# Patient Record
Sex: Male | Born: 1950 | Race: White | Hispanic: No | Marital: Married | Smoking: Former smoker
Health system: Southern US, Community
[De-identification: ages and names within clinical notes are randomized; demographics above are authoritative.]

## PROBLEM LIST (undated history)

## (undated) DIAGNOSIS — Z148 Genetic carrier of other disease: Secondary | ICD-10-CM

## (undated) DIAGNOSIS — Z95828 Presence of other vascular implants and grafts: Secondary | ICD-10-CM

## (undated) DIAGNOSIS — Z973 Presence of spectacles and contact lenses: Secondary | ICD-10-CM

## (undated) DIAGNOSIS — K219 Gastro-esophageal reflux disease without esophagitis: Secondary | ICD-10-CM

## (undated) DIAGNOSIS — G709 Myoneural disorder, unspecified: Secondary | ICD-10-CM

## (undated) DIAGNOSIS — G4733 Obstructive sleep apnea (adult) (pediatric): Secondary | ICD-10-CM

## (undated) DIAGNOSIS — Z800 Family history of malignant neoplasm of digestive organs: Secondary | ICD-10-CM

## (undated) DIAGNOSIS — Z860101 Personal history of adenomatous and serrated colon polyps: Secondary | ICD-10-CM

## (undated) DIAGNOSIS — R351 Nocturia: Secondary | ICD-10-CM

## (undated) DIAGNOSIS — I493 Ventricular premature depolarization: Secondary | ICD-10-CM

## (undated) DIAGNOSIS — Z86010 Personal history of colonic polyps: Secondary | ICD-10-CM

## (undated) DIAGNOSIS — M51369 Other intervertebral disc degeneration, lumbar region without mention of lumbar back pain or lower extremity pain: Secondary | ICD-10-CM

## (undated) DIAGNOSIS — I714 Abdominal aortic aneurysm, without rupture, unspecified: Secondary | ICD-10-CM

## (undated) DIAGNOSIS — C61 Malignant neoplasm of prostate: Secondary | ICD-10-CM

## (undated) DIAGNOSIS — J45909 Unspecified asthma, uncomplicated: Secondary | ICD-10-CM

## (undated) DIAGNOSIS — K603 Anal fistula, unspecified: Secondary | ICD-10-CM

## (undated) DIAGNOSIS — E119 Type 2 diabetes mellitus without complications: Secondary | ICD-10-CM

## (undated) DIAGNOSIS — Z8679 Personal history of other diseases of the circulatory system: Secondary | ICD-10-CM

## (undated) DIAGNOSIS — Z974 Presence of external hearing-aid: Secondary | ICD-10-CM

## (undated) DIAGNOSIS — K649 Unspecified hemorrhoids: Secondary | ICD-10-CM

## (undated) DIAGNOSIS — M5136 Other intervertebral disc degeneration, lumbar region: Secondary | ICD-10-CM

## (undated) DIAGNOSIS — Z298 Encounter for other specified prophylactic measures: Secondary | ICD-10-CM

## (undated) DIAGNOSIS — E785 Hyperlipidemia, unspecified: Secondary | ICD-10-CM

## (undated) DIAGNOSIS — C3490 Malignant neoplasm of unspecified part of unspecified bronchus or lung: Secondary | ICD-10-CM

## (undated) DIAGNOSIS — B029 Zoster without complications: Secondary | ICD-10-CM

## (undated) DIAGNOSIS — T7840XA Allergy, unspecified, initial encounter: Secondary | ICD-10-CM

## (undated) DIAGNOSIS — R001 Bradycardia, unspecified: Secondary | ICD-10-CM

## (undated) DIAGNOSIS — Z8709 Personal history of other diseases of the respiratory system: Secondary | ICD-10-CM

## (undated) DIAGNOSIS — C439 Malignant melanoma of skin, unspecified: Secondary | ICD-10-CM

## (undated) DIAGNOSIS — I10 Essential (primary) hypertension: Secondary | ICD-10-CM

## (undated) HISTORY — DX: Encounter for other specified prophylactic measures: Z29.8

## (undated) HISTORY — PX: COLONOSCOPY: SHX174

## (undated) HISTORY — PX: POLYPECTOMY: SHX149

## (undated) HISTORY — PX: APPENDECTOMY: SHX54

## (undated) HISTORY — DX: Genetic carrier of other disease: Z14.8

## (undated) HISTORY — PX: PROSTATE SURGERY: SHX751

## (undated) HISTORY — PX: OTHER SURGICAL HISTORY: SHX169

## (undated) HISTORY — DX: Zoster without complications: B02.9

## (undated) HISTORY — DX: Allergy, unspecified, initial encounter: T78.40XA

## (undated) HISTORY — DX: Hyperlipidemia, unspecified: E78.5

## (undated) HISTORY — DX: Obstructive sleep apnea (adult) (pediatric): G47.33

## (undated) HISTORY — PX: LUNG CANCER SURGERY: SHX702

## (undated) HISTORY — PX: BACK SURGERY: SHX140

## (undated) HISTORY — DX: Unspecified asthma, uncomplicated: J45.909

## (undated) HISTORY — DX: Gastro-esophageal reflux disease without esophagitis: K21.9

## (undated) HISTORY — DX: Malignant neoplasm of prostate: C61

## (undated) HISTORY — DX: Malignant melanoma of skin, unspecified: C43.9

## (undated) HISTORY — DX: Bradycardia, unspecified: R00.1

---

## 1999-12-28 ENCOUNTER — Emergency Department (HOSPITAL_COMMUNITY): Admission: EM | Admit: 1999-12-28 | Discharge: 1999-12-28 | Payer: Self-pay | Admitting: Emergency Medicine

## 2000-02-13 ENCOUNTER — Encounter: Payer: Self-pay | Admitting: Neurosurgery

## 2000-02-13 ENCOUNTER — Ambulatory Visit (HOSPITAL_COMMUNITY): Admission: RE | Admit: 2000-02-13 | Discharge: 2000-02-13 | Payer: Self-pay | Admitting: Neurosurgery

## 2000-02-16 ENCOUNTER — Encounter: Payer: Self-pay | Admitting: Neurosurgery

## 2000-02-16 ENCOUNTER — Ambulatory Visit (HOSPITAL_COMMUNITY): Admission: RE | Admit: 2000-02-16 | Discharge: 2000-02-16 | Payer: Self-pay | Admitting: Neurosurgery

## 2000-02-26 ENCOUNTER — Encounter: Payer: Self-pay | Admitting: Neurosurgery

## 2000-02-27 ENCOUNTER — Encounter: Payer: Self-pay | Admitting: Neurosurgery

## 2000-02-27 ENCOUNTER — Inpatient Hospital Stay (HOSPITAL_COMMUNITY): Admission: RE | Admit: 2000-02-27 | Discharge: 2000-03-02 | Payer: Self-pay | Admitting: Neurosurgery

## 2000-02-27 HISTORY — PX: LUMBAR LAMINECTOMY: SHX95

## 2000-03-13 ENCOUNTER — Encounter: Admission: RE | Admit: 2000-03-13 | Discharge: 2000-06-11 | Payer: Self-pay | Admitting: Neurosurgery

## 2000-03-21 ENCOUNTER — Ambulatory Visit (HOSPITAL_COMMUNITY): Admission: RE | Admit: 2000-03-21 | Discharge: 2000-03-21 | Payer: Self-pay | Admitting: Neurosurgery

## 2000-03-21 ENCOUNTER — Encounter: Payer: Self-pay | Admitting: Neurosurgery

## 2000-04-05 ENCOUNTER — Encounter: Admission: RE | Admit: 2000-04-05 | Discharge: 2000-07-04 | Payer: Self-pay | Admitting: Neurosurgery

## 2000-06-12 ENCOUNTER — Encounter: Admission: RE | Admit: 2000-06-12 | Discharge: 2000-09-10 | Payer: Self-pay | Admitting: Neurosurgery

## 2000-07-29 ENCOUNTER — Encounter: Payer: Self-pay | Admitting: Neurosurgery

## 2000-07-29 ENCOUNTER — Ambulatory Visit (HOSPITAL_COMMUNITY): Admission: RE | Admit: 2000-07-29 | Discharge: 2000-07-29 | Payer: Self-pay | Admitting: Neurosurgery

## 2000-09-13 ENCOUNTER — Encounter: Admission: RE | Admit: 2000-09-13 | Discharge: 2000-09-13 | Payer: Self-pay | Admitting: Neurosurgery

## 2004-12-18 ENCOUNTER — Encounter: Admission: RE | Admit: 2004-12-18 | Discharge: 2004-12-18 | Payer: Self-pay | Admitting: Family Medicine

## 2005-03-26 ENCOUNTER — Ambulatory Visit: Payer: Self-pay | Admitting: Internal Medicine

## 2005-04-02 DIAGNOSIS — Z860100 Personal history of colon polyps, unspecified: Secondary | ICD-10-CM | POA: Insufficient documentation

## 2005-04-06 ENCOUNTER — Ambulatory Visit: Payer: Self-pay | Admitting: Internal Medicine

## 2009-11-08 ENCOUNTER — Encounter (INDEPENDENT_AMBULATORY_CARE_PROVIDER_SITE_OTHER): Payer: Self-pay | Admitting: *Deleted

## 2009-11-09 ENCOUNTER — Ambulatory Visit: Payer: Self-pay | Admitting: Internal Medicine

## 2009-11-09 ENCOUNTER — Encounter (INDEPENDENT_AMBULATORY_CARE_PROVIDER_SITE_OTHER): Payer: Self-pay | Admitting: *Deleted

## 2009-11-22 ENCOUNTER — Ambulatory Visit: Payer: Self-pay | Admitting: Internal Medicine

## 2009-12-02 ENCOUNTER — Encounter: Payer: Self-pay | Admitting: Internal Medicine

## 2009-12-31 DIAGNOSIS — Z8546 Personal history of malignant neoplasm of prostate: Secondary | ICD-10-CM

## 2009-12-31 DIAGNOSIS — C61 Malignant neoplasm of prostate: Secondary | ICD-10-CM

## 2009-12-31 HISTORY — DX: Malignant neoplasm of prostate: C61

## 2009-12-31 HISTORY — DX: Personal history of malignant neoplasm of prostate: Z85.46

## 2010-06-30 HISTORY — PX: ROBOT ASSISTED LAPAROSCOPIC RADICAL PROSTATECTOMY: SHX5141

## 2010-11-13 ENCOUNTER — Ambulatory Visit (HOSPITAL_BASED_OUTPATIENT_CLINIC_OR_DEPARTMENT_OTHER): Admission: RE | Admit: 2010-11-13 | Discharge: 2010-11-13 | Payer: Self-pay | Admitting: Urology

## 2010-11-13 HISTORY — PX: CYSTOSCOPY WITH URETHRAL DILATATION: SHX5125

## 2011-01-21 ENCOUNTER — Encounter: Payer: Self-pay | Admitting: Unknown Physician Specialty

## 2011-02-01 ENCOUNTER — Other Ambulatory Visit: Payer: Self-pay | Admitting: Dermatology

## 2011-03-13 LAB — POCT I-STAT 4, (NA,K, GLUC, HGB,HCT)
Glucose, Bld: 120 mg/dL — ABNORMAL HIGH (ref 70–99)
HCT: 44 % (ref 39.0–52.0)

## 2011-04-04 ENCOUNTER — Other Ambulatory Visit: Payer: Self-pay | Admitting: Dermatology

## 2011-04-17 ENCOUNTER — Other Ambulatory Visit: Payer: Self-pay | Admitting: Dermatology

## 2011-05-18 NOTE — Op Note (Signed)
[place]  Patient:    Jesse Perez, Jesse Perez                   MRN: [id number] Proc. Date: 02/27/00 Adm. Date:  [id number] Attending:  Emeterio Reeve                           Operative Report  PREOPERATIVE DIAGNOSIS:  Herniated disk at L4.  POSTOPERATIVE DIAGNOSIS:  L3-4 herniated disk with intradural extension.  OPERATION PERFORMED:  L4 hemilaminectomy, L3 hemisemilaminectomy and exploration of the L4-5 and L3-4 disk spaces.  SURGEON:  Payton Doughty, M.D.  ANESTHESIA:  General endotracheal.  PREP:  Sterile Betadine prep and scrub with alcohol wipe.  COMPLICATIONS:  CSF leak.  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  DESCRIPTION OF PROCEDURE:  The patient was taken to the operating room, smoothly anesthetized and intubated, and placed prone on the operating table.  Following  shave, prep and drape in the usual sterile fashion, the skin was infiltrated with 1% lidocaine 1:400,000 epinephrine.  The skin was incised from the bottom of L5 to the bottom of L3 and lamina of L3 and L4 were exposed on the right side in the subperiosteal plane.  Intraoperative x-ray confirmed correctness of the level.  Hemilaminectomy of L4 was carried out as well as a hemisemilaminectomy of L3 on the right inferior side. The ligamentum flavum was identified, elevated and removed. The L4 root was demonstrated as it rounded the L4 pedicle.  Medial retraction of the dural sac at that level revealed large fragments of disk which were removed  without difficulty.  This was followed down to the L4-5 interspace which was found to be intact without evidence of an annular tear.  Working above the 4 root and  lateral to it, exploration was carried superiorly.  At the L3-4 interspace, more disk material was found.  It was carefully dissected and was found to go  into the 3-4 disk space but it was also found to pierce the dura just above the beginning of the L4 root on the right side.  This was dissected free and carefully removed from the dural sac and was slightly adherent to several small nerves which were carefully dissected off of it and gently pushed back into the sac.  The defect n the sac where the disk had passed through was covered with thrombin soaked Gelfoam. There was no leak to Valsalva.  The plan was to keep the patient down postoperatively.  Following this, the wound was irrigated and hemostasis assured.  The nerve roots carefully explored to make sure that they were free of disk.  The fascia was reapproximated with 3-0 Vicryl in interrupted fashion.  The subcutaneous tissues were reapproximated 3-0 Vicryl in interrupted fashion.  The subcuticular tissues were reapproximated 3-0 Vicryl in interrupted fashion.  The skin was closed with 3-0 nylon in a running locked fashion.  Betadine and Telfa dressing was applied and made occlusive with Op-Site.  The patient then returned to the recovery room in  good condition. DD:  02/27/00 TD:  02/27/00 Job: [id number]

## 2011-05-18 NOTE — Discharge Summary (Signed)
[name]  Patient:    Jesse Perez, Jesse Perez                   MRN: [id number] Adm. Date:  [id number] Disc. Date: [id number] Attending:  Emeterio Reeve                           Discharge Summary  ADMISSION DIAGNOSIS:  Herniated nucleus pulposus L3-4 with intradural extension.  DISCHARGE DIAGNOSES:  Herniated nucleus pulposus L3-4 with intradural extension. Right dorsiflexor weakness.  CONDITION ON DISCHARGE:  Stable.  HOSPITAL COURSE:  The patient is a 60 year old individual who had significant lumbar radiculopathy on the right with a severe amount of pain. He was found to have a large herniated nucleus pulposus at the level of L3-4. He underwent surgical extirpation of the disk. However, at that time of the surgery, it was found that the disk had ruptured through the dura; and there was a significant rent in the dura. He underwent surgical repair of the rent. However, postoperatively, it was also noted that he had evidence of foot drop on the right lower extremity. He was maintained at bedrest for the first 48 hours. Subsequently, he was ambulated. He tolerated this well. He does have foot drop on the right side and numbness in the L5 distribution on that right side. His incision is clean and dry. He has been using a minimal amount of pain medication. He will be seen in the office for further outpatient follow-up in a weeks time for suture removal. Prescription for Vicodin and Xanax was written at the time of discharge. He will also be given a walker at the time of discharge. DD:  03/02/00 TD:  03/04/00 Job: [id number]

## 2011-08-27 ENCOUNTER — Emergency Department (INDEPENDENT_AMBULATORY_CARE_PROVIDER_SITE_OTHER): Payer: BC Managed Care – PPO

## 2011-08-27 ENCOUNTER — Other Ambulatory Visit: Payer: Self-pay

## 2011-08-27 ENCOUNTER — Emergency Department (HOSPITAL_BASED_OUTPATIENT_CLINIC_OR_DEPARTMENT_OTHER)
Admission: EM | Admit: 2011-08-27 | Discharge: 2011-08-27 | Disposition: A | Discharge status: Home / Self Care | Payer: BC Managed Care – PPO | Attending: Emergency Medicine | Admitting: Emergency Medicine

## 2011-08-27 ENCOUNTER — Encounter: Payer: Self-pay | Admitting: Family Medicine

## 2011-08-27 DIAGNOSIS — J329 Chronic sinusitis, unspecified: Secondary | ICD-10-CM

## 2011-08-27 DIAGNOSIS — I10 Essential (primary) hypertension: Secondary | ICD-10-CM

## 2011-08-27 DIAGNOSIS — R55 Syncope and collapse: Secondary | ICD-10-CM | POA: Insufficient documentation

## 2011-08-27 DIAGNOSIS — R51 Headache: Secondary | ICD-10-CM

## 2011-08-27 DIAGNOSIS — J3489 Other specified disorders of nose and nasal sinuses: Secondary | ICD-10-CM

## 2011-08-27 DIAGNOSIS — E119 Type 2 diabetes mellitus without complications: Secondary | ICD-10-CM | POA: Insufficient documentation

## 2011-08-27 HISTORY — DX: Essential (primary) hypertension: I10

## 2011-08-27 LAB — BASIC METABOLIC PANEL
BUN: 18 mg/dL (ref 6–23)
CO2: 24 mEq/L (ref 19–32)
Chloride: 102 mEq/L (ref 96–112)
Creatinine, Ser: 0.8 mg/dL (ref 0.50–1.35)
Glucose, Bld: 113 mg/dL — ABNORMAL HIGH (ref 70–99)

## 2011-08-27 LAB — CBC
HCT: 43.2 % (ref 39.0–52.0)
Hemoglobin: 15.2 g/dL (ref 13.0–17.0)
MCHC: 35.2 g/dL (ref 30.0–36.0)
WBC: 9 10*3/uL (ref 4.0–10.5)

## 2011-08-27 LAB — DIFFERENTIAL
Lymphocytes Relative: 28 % (ref 12–46)
Lymphs Abs: 2.5 10*3/uL (ref 0.7–4.0)
Monocytes Absolute: 0.5 10*3/uL (ref 0.1–1.0)
Monocytes Relative: 6 % (ref 3–12)
Neutro Abs: 5.1 10*3/uL (ref 1.7–7.7)

## 2011-08-27 LAB — CARDIAC PANEL(CRET KIN+CKTOT+MB+TROPI): Relative Index: 1.4 (ref 0.0–2.5)

## 2011-08-27 MED ORDER — AZITHROMYCIN 250 MG PO TABS: 500.0000 mg | ORAL_TABLET | Freq: Every day | ORAL | Status: AC

## 2011-08-27 MED ORDER — AMLODIPINE BESYLATE 10 MG PO TABS: 5.0000 mg | ORAL_TABLET | Freq: Every day | ORAL | Status: DC

## 2011-08-27 NOTE — ED Notes (Signed)
Pt sts he has h/o diabetes and htn and was taking meds but was able to stop meds and control with diet and exercise.

## 2011-08-27 NOTE — ED Provider Notes (Signed)
Medical screening examination/treatment/procedure(s) were performed by non-physician practitioner and as supervising physician I was immediately available for consultation/collaboration.   Charles B. Bernette Mayers, MD 08/27/11 1807

## 2011-08-27 NOTE — ED Provider Notes (Signed)
History     CSN: [id number] Arrival date & time: 08/27/2011  3:48 PM  Chief Complaint  Patient presents with  . Hypertension   HPI Comments: Pt states that he was sitting in a meeting at work and he became diaphoretic and not feeling normal:pt states that he went to the nurse and felt near syncopal and developed a headache:pt denies cp or sob:pt states that he was diagnosed with htn and diabetes 2 years ago and then last year after a year of exercise and eating right they took him off all of his medication and he has not had any problems:pt states that the last time he was seen was in October of 2011  Patient is a 60 y.o. male presenting with hypertension. The history is provided by the patient. No language interpreter was used.  Hypertension This is a recurrent problem. The current episode started today. The problem occurs constantly. The problem has been unchanged. Associated symptoms include diaphoresis and headaches. Pertinent negatives include no abdominal pain, chest pain, coughing, fever, nausea, neck pain or numbness. The symptoms are aggravated by nothing. He has tried nothing for the symptoms.    Past Medical History  Diagnosis Date  . Hypertension   . Diabetes mellitus     Past Surgical History  Procedure Date  . Back surgery   . Prostate surgery   . Appendectomy     Family History  Problem Relation Age of Onset  . Cancer Mother   . Hypertension Father   . Stroke Father   . Cancer Sister     History  Substance Use Topics  . Smoking status: Never Smoker   . Smokeless tobacco: Not on file  . Alcohol Use: Yes      Review of Systems  Constitutional: Positive for diaphoresis. Negative for fever.  HENT: Negative for neck pain.   Respiratory: Negative for cough.   Cardiovascular: Negative for chest pain.  Gastrointestinal: Negative for nausea and abdominal pain.  Neurological: Positive for headaches. Negative for numbness.  All other systems reviewed and are  negative.    Physical Exam  BP 201/99  Pulse 80  Temp(Src) 98.1 F (36.7 C) (Oral)  Resp 18  Ht 5\' 10"  (1.778 m)  Wt 199 lb (90.266 kg)  BMI 28.55 kg/m2  SpO2 100%  Physical Exam  Nursing note and vitals reviewed. Constitutional: He is oriented to person, place, and time. He appears well-developed and well-nourished.  HENT:  Head: Normocephalic and atraumatic.  Eyes: Pupils are equal, round, and reactive to light.  Neck: Normal range of motion. Neck supple.  Cardiovascular: Normal rate and regular rhythm.   Pulmonary/Chest: Effort normal and breath sounds normal.  Abdominal: Soft. Bowel sounds are normal.  Neurological: He is alert and oriented to person, place, and time.  Skin: Skin is warm and dry.  Psychiatric: He has a normal mood and affect.    ED Course  Procedures Results for orders placed during the hospital encounter of 08/27/11  CBC      Component Value Range   WBC 9.0  4.0 - 10.5 (K/uL)   RBC 5.30  4.22 - 5.81 (MIL/uL)   Hemoglobin 15.2  13.0 - 17.0 (g/dL)   HCT 40.9  81.1 - 91.4 (%)   MCV 81.5  78.0 - 100.0 (fL)   MCH 28.7  26.0 - 34.0 (pg)   MCHC 35.2  30.0 - 36.0 (g/dL)   RDW 78.2  95.6 - 21.3 (%)   Platelets 223  150 -  400 (K/uL)  DIFFERENTIAL      Component Value Range   Neutrophils Relative 57  43 - 77 (%)   Neutro Abs 5.1  1.7 - 7.7 (K/uL)   Lymphocytes Relative 28  12 - 46 (%)   Lymphs Abs 2.5  0.7 - 4.0 (K/uL)   Monocytes Relative 6  3 - 12 (%)   Monocytes Absolute 0.5  0.1 - 1.0 (K/uL)   Eosinophils Relative 9 (*) 0 - 5 (%)   Eosinophils Absolute 0.8 (*) 0.0 - 0.7 (K/uL)   Basophils Relative 0  0 - 1 (%)   Basophils Absolute 0.0  0.0 - 0.1 (K/uL)  BASIC METABOLIC PANEL      Component Value Range   Sodium 139  135 - 145 (mEq/L)   Potassium 3.6  3.5 - 5.1 (mEq/L)   Chloride 102  96 - 112 (mEq/L)   CO2 24  19 - 32 (mEq/L)   Glucose, Bld 113 (*) 70 - 99 (mg/dL)   BUN 18  6 - 23 (mg/dL)   Creatinine, Ser 1.61  0.50 - 1.35 (mg/dL)    Calcium 9.1  8.4 - 10.5 (mg/dL)   GFR calc non Af Amer >60  >60 (mL/min)   GFR calc Af Amer >60  >60 (mL/min)  CARDIAC PANEL(CRET KIN+CKTOT+MB+TROPI)      Component Value Range   Total CK 203  7 - 232 (U/L)   CK, MB 2.8  0.3 - 4.0 (ng/mL)   Troponin I <0.30  <0.30 (ng/mL)   Relative Index 1.4  0.0 - 2.5    Dg Chest 2 View  08/27/2011  *RADIOLOGY REPORT*  Clinical Data: Hypertension.  CHEST - 2 VIEW  Comparison: Chest radiograph 12/18/2004  Findings: Heart, mediastinal, and hilar contours are within normal limits.  Mild pleuroparenchymal thickening at the lung apices is stable.  No focal airspace disease or pleural effusion is identified. One of the the costophrenic angles is partially excluded on the lateral view, posteriorly.  No acute bony abnormality.  IMPRESSION: No acute cardiopulmonary disease.  Original Report Authenticated By: Britta Mccreedy, M.D.   Ct Head Wo Contrast  08/27/2011  *RADIOLOGY REPORT*  Clinical Data: Hypertension, headache  CT HEAD WITHOUT CONTRAST  Technique:  Contiguous axial images were obtained from the base of the skull through the vertex without contrast.  Comparison: None.  Findings: No acute intracranial hemorrhage, mass lesion, midline shift, infarction, hydrocephalus, herniation, or extra-axial fluid collection.  Gray-white matter differentiation maintained. Cisterns patent.  No cerebellar abnormality.  Mastoids clear.  Heterogeneous complete opacification of the visualized maxillary, ethmoid and frontal sinuses.  Scattered sinus hyperdense opacity noted, sinus hemorrhage not excluded.  Other considerations include chronic sinusitis including atypical infections such as fungal sinus disease.  IMPRESSION: No acute intracranial process.  Extensive frontal, ethmoid and maxillary sinus heterogeneous opacification with hyperdense components, consistent with sinusitis, consider fungal sinusitis especially if the patient is diabetic.  Original Report Authenticated By: Judie Petit. Ruel Favors, M.D.    Date: 08/27/2011  Rate: 73  Rhythm: normal sinus rhythm  QRS Axis: normal  Intervals: normal  ST/T Wave abnormalities: normal  Conduction Disutrbances:none  Narrative Interpretation:   Old EKG Reviewed: unchanged    MDM Pt is feeling better at this time:pt was on amlodipine 5mg  QD will restart:will treat for sinusitis      Teressa Lower, NP 08/27/11 1801  Teressa Lower, NP 08/27/11 1801  Teressa Lower, NP 08/27/11 1806

## 2011-08-27 NOTE — ED Notes (Signed)
Pt c/o "not feeling normal". Pt sts while in a meeting at work he became diaphoretic, light headed and developed a headache. Pt had lab work done at work and was brought here by Engineer, civil (consulting). Pt denies shob, n/v.

## 2011-09-05 HISTORY — PX: US ECHOCARDIOGRAPHY: HXRAD669

## 2012-10-27 ENCOUNTER — Ambulatory Visit
Admission: RE | Admit: 2012-10-27 | Discharge: 2012-10-27 | Disposition: A | Discharge status: Home / Self Care | Payer: BC Managed Care – PPO | Source: Ambulatory Visit | Attending: Emergency Medicine | Admitting: Emergency Medicine

## 2012-10-27 ENCOUNTER — Other Ambulatory Visit: Payer: Self-pay | Admitting: Emergency Medicine

## 2012-10-27 DIAGNOSIS — R52 Pain, unspecified: Secondary | ICD-10-CM

## 2013-04-06 ENCOUNTER — Other Ambulatory Visit (HOSPITAL_COMMUNITY): Payer: Self-pay | Admitting: Family Medicine

## 2013-04-06 ENCOUNTER — Ambulatory Visit
Admission: RE | Admit: 2013-04-06 | Discharge: 2013-04-06 | Disposition: A | Discharge status: Home / Self Care | Payer: BC Managed Care – PPO | Source: Ambulatory Visit | Attending: Family Medicine | Admitting: Family Medicine

## 2013-04-06 DIAGNOSIS — R062 Wheezing: Secondary | ICD-10-CM

## 2013-04-06 DIAGNOSIS — J329 Chronic sinusitis, unspecified: Secondary | ICD-10-CM

## 2013-10-13 ENCOUNTER — Ambulatory Visit: Payer: BC Managed Care – PPO | Admitting: Lab

## 2013-10-13 ENCOUNTER — Encounter: Payer: Self-pay | Admitting: Genetic Counselor

## 2013-10-13 ENCOUNTER — Ambulatory Visit (HOSPITAL_BASED_OUTPATIENT_CLINIC_OR_DEPARTMENT_OTHER): Payer: BC Managed Care – PPO | Admitting: Genetic Counselor

## 2013-10-13 DIAGNOSIS — Z1501 Genetic susceptibility to malignant neoplasm of breast: Secondary | ICD-10-CM

## 2013-10-13 DIAGNOSIS — Z148 Genetic carrier of other disease: Secondary | ICD-10-CM | POA: Insufficient documentation

## 2013-10-13 DIAGNOSIS — C61 Malignant neoplasm of prostate: Secondary | ICD-10-CM

## 2013-10-13 DIAGNOSIS — Z803 Family history of malignant neoplasm of breast: Secondary | ICD-10-CM

## 2013-10-13 DIAGNOSIS — IMO0002 Reserved for concepts with insufficient information to code with codable children: Secondary | ICD-10-CM

## 2013-10-13 NOTE — Progress Notes (Signed)
Dr.  Bufford Spikes requested a consultation for genetic counseling and risk assessment for Jesse Perez, a 62 y.o. male, for discussion of his personal history of prostate cancer, family history of breast cancer and known family mutations in CHEK2 and ATM.  He presents to clinic today to discuss the possibility of a genetic predisposition to cancer, and to further clarify his risks, as well as his family members' risks for cancer.   HISTORY OF PRESENT ILLNESS: In 2011, at the age of 62, Jesse Perez was diagnosed with prostate cancer. This was treated with surgery.  He reports a gleason score of 3.  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  This was after his daughter was found to have elevated iron levels and was tested and found to be a dbl heterozygote for HFE.  In September 2014 his daughter was diagnosed with breast cancer.  She underwent genetic testing and was found to carry a mutation in her ATM and CHEK2 genes.      Past Medical History  Diagnosis Date  . Hypertension   . Diabetes mellitus   . Prostate cancer 2011    Gleason score=3  . Hemochromatosis carrier     Past Surgical History  Procedure Laterality Date  . Back surgery    . Prostate surgery    . Appendectomy      History   Social History  . Marital Status: Married    Spouse Name: N/A    Number of Children: 3  . Years of Education: N/A   Occupational History  .  [employer]   Social History Main Topics  . Smoking status: Never Smoker   . Smokeless tobacco: None  . Alcohol Use: Yes  . Drug Use: No  . Sexual Activity: None   Other Topics Concern  . None   Social History Narrative  . None   PERSONAL RISK ASSESSMENT FACTORS: Prostate cancer: Gleason score=3 HFE carrier    FAMILY HISTORY:  We obtained a detailed, 4-generation family history.  Significant diagnoses are listed below: Family History  Problem Relation Age of Onset  . Breast cancer Mother 41  .  Hypertension Father   . Stroke Father   . Breast cancer Sister 55    BRCA neg  . Breast cancer Maternal Grandmother     dx in her 65s  . Breast cancer Daughter 84    ATM and CHEK2 carrier  . Hemochromatosis Daughter 67    C282Y and S65C double heterozygote    Patient's ancestors are of [place] descent. There is no reported Ashkenazi Jewish ancestry. There is no known consanguinity.  GENETIC COUNSELING ASSESSMENT: Jesse Perez is a 62 y.o. male with a personal history of prostate cancer and HFE carrier and family history of breast cancer with known family mutations in the ATM and CHEK2 genes which suggestive of a hereditary cancer syndrome and predisposition to cancer. We, therefore, discussed and recommended the following at today's visit.   DISCUSSION: We reviewed the characteristics, features and inheritance patterns of hereditary cancer syndromes. We also discussed genetic testing, including the appropriate family members to test, the process of testing, insurance coverage and turn-around-time for results. The ATM and CHEK2 genes are involved in the detection and surveillance of DNA damage. ATM phosphorylation of CHEK2 and BRCA1 (as well as CHEK2 phosphorylation of BRCA1) is critical for proper response to DNA double-strand breaks. This is believed to be the role ATM and CHEK2 have in  breast cancer risk. ATM and CHEK2 are considered to be moderate risk breast cancer genes.   Having one ATM gene mutation increases the risk 5-fold for breast cancer under the age of 88, and 2-3 fold for breast cancer overall. In families with familial breast cancer that were negative for BRCA1 or BRCA2 genes, approximately 2.7% of women were found to have one ATM mutation. ATM mutations increase the risk for other cancers as well, although the exact risks have not been quantified. Other cancers include colon and pancreatic cancer. Individuals with one ATM mutation, who have children with another ATM  carrier, could have a child who have 2 (homozygous) ATM mutations. Homozygous ATM mutation carriers have a condition called ataxia-telangiectasia (AT). AT is characterized by progressive cerebellar degeneration (ataxia), dilated blood vessels in the eyes and skin (telangiectasia), immunodeficiency, chromosomal instability, increased sensitivity to ionizing radiation and a predisposition to lymphoma and leukemia.   Having one CHEK2 mutation increases the lifetime risk for breast cancer in women between 23-48%, depending on family history. An increased risk for male breast cancer is also seen, with about a 1% lifetime risk. Other cancers can also be seen in families with CHEK2 mutations, including prostate and colon cancer. Again, these risks have yet to be fully quantified. 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   Lastly, we discussed that other family members need to be tested. His sons have a 50% chance of having either an ATM or CHEK2 mutation. They have a 25% of having both, and a 25% chance of having neither gene. They are also at risk for having hemochromatosis.  Mr. Hyson is being tested today to determine his risk for cancer and how extended family members should be tested. It will be important for extended family members to know what to be tested for based on whether or not both mutations are coming from one side of the family.   PLAN: After considering the risks, benefits, and limitations, ALDWIN MICALIZZI provided informed consent to pursue genetic testing and the blood sample will be sent to [facility] for analysis of the ATM and CHEK2 genes. We discussed the implications of a positive, negative and/ or variant of uncertain significance genetic test result. Results should be available within approximately 2 weeks' time, at which point they will be disclosed by telephone to Jesse Perez,  as will any additional recommendations warranted by these results. Jesse Perez will receive a summary of his genetic counseling visit and a copy of his results once available. This information will also be available in Epic. We encouraged HIGINIO GROW to remain in contact with cancer genetics annually so that we can continuously update the family history and inform him of any changes in cancer genetics and testing that may be of benefit for his family. Bertram Millard Santillana's questions were answered to his satisfaction today. Our contact information was provided should additional questions or concerns arise.  The patient was seen for a total of 45 minutes, greater than 50% of which was spent face-to-face counseling.  This note will also be sent to the referring provider via the electronic medical record. The patient will be supplied with a summary of this genetic counseling discussion as well as educational information on the discussed hereditary cancer syndromes following the conclusion of their visit.   Patient was discussed with Dr. Drue Second.   _______________________________________________________________________ For Office Staff:  Number of people involved in session: 3 Was an Intern/ student involved with case: no

## 2013-10-21 ENCOUNTER — Telehealth: Payer: Self-pay | Admitting: Genetic Counselor

## 2013-10-21 ENCOUNTER — Encounter: Payer: Self-pay | Admitting: Genetic Counselor

## 2013-10-21 ENCOUNTER — Telehealth: Payer: Self-pay | Admitting: Internal Medicine

## 2013-10-21 DIAGNOSIS — Z1501 Genetic susceptibility to malignant neoplasm of breast: Secondary | ICD-10-CM | POA: Insufficient documentation

## 2013-10-21 DIAGNOSIS — Z1509 Genetic susceptibility to other malignant neoplasm: Secondary | ICD-10-CM | POA: Insufficient documentation

## 2013-10-21 NOTE — Telephone Encounter (Signed)
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  Discussed getting his sons in for testing.  They will be scheduled for 11/10/13 at 9 AM.  He will notify his siblings and maternal cousins as well.

## 2013-10-21 NOTE — Telephone Encounter (Signed)
Left a message for Angelique Blonder to return call

## 2013-10-22 NOTE — Telephone Encounter (Signed)
That does not affect colonoscopy recall date

## 2013-10-22 NOTE — Telephone Encounter (Signed)
Patient's daughter newly diagnosed with breast cancer. He is in for a recall for 10/2014.  Elisabeth Cara is asking if this will change his recall date??

## 2013-10-22 NOTE — Telephone Encounter (Signed)
Dr. Leone Payor patient has had recent appt with genetic counseling.  They are saying ([employer]) he has the genetic marker for colon cancer.  Will you please review note 10/16/13 and see if this changes his recall.

## 2013-10-29 NOTE — Telephone Encounter (Signed)
I have left a message for Jesse Perez with Dr. Marvell Fuller recommendations.  I've asked that she call back for any additional questions or concerns

## 2013-10-29 NOTE — Telephone Encounter (Signed)
Based upon my review of the genetics counselor report and his hx of polyps he should stay with 10/2014 colonoscopy recall

## 2014-03-01 ENCOUNTER — Other Ambulatory Visit: Payer: Self-pay | Admitting: *Deleted

## 2014-03-01 MED ORDER — LOSARTAN POTASSIUM 50 MG PO TABS
50.0000 mg | ORAL_TABLET | Freq: Every day | ORAL | Status: DC
Start: 2014-03-01 — End: 2014-06-18

## 2014-03-01 NOTE — Telephone Encounter (Signed)
Rx was sent to pharmacy electronically. 

## 2014-03-05 ENCOUNTER — Encounter: Payer: Self-pay | Admitting: Cardiovascular Disease

## 2014-03-05 ENCOUNTER — Ambulatory Visit (INDEPENDENT_AMBULATORY_CARE_PROVIDER_SITE_OTHER): Payer: BC Managed Care – PPO | Admitting: Cardiovascular Disease

## 2014-03-05 VITALS — BP 116/78 | HR 80 | Ht 70.0 in | Wt 217.6 lb

## 2014-03-05 DIAGNOSIS — G4733 Obstructive sleep apnea (adult) (pediatric): Secondary | ICD-10-CM

## 2014-03-05 DIAGNOSIS — I10 Essential (primary) hypertension: Secondary | ICD-10-CM

## 2014-03-05 NOTE — Patient Instructions (Signed)
Follow up with Dr Berry as needed.  

## 2014-03-05 NOTE — Assessment & Plan Note (Signed)
Under good control and her medications 

## 2014-03-05 NOTE — Progress Notes (Signed)
03/05/2014 Jesse Perez   1951/01/10  [id number]  Primary Physician Pauline Good, MD Primary Cardiologist: Lorretta Harp MD Renae Gloss   HPI:  The patient is a very pleasant 63 year old, moderately overweight, married Caucasian male, father of 3 who I last saw a year ago. 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 He initially saw him because of bradycardia, hypertension and diaphoresis. Renal Dopplers were normal. His risk factors include 70-pack-year history of tobacco abuse having quit back in 1998, and diet-controlled diabetes as well as family history. A 2D echo showed LVH with normal LV function. He was diagnosed with obstructive sleep apnea intolerant to CPAP. Dr. Juventino Slovak follows his lipid profile. Since I saw him one year ago Jesse Perez without symptoms of chest pain or shortness of breath.    Current Outpatient Prescriptions  Medication Sig Dispense Refill  . amLODipine (NORVASC) 10 MG tablet Take 0.5 tablets (5 mg total) by mouth daily.  30 tablet  0  . desonide (DESOWEN) 0.05 % lotion Apply topically every other day.        . loratadine (CLARITIN) 10 MG tablet Take 10 mg by mouth daily.        Marland Kitchen losartan (COZAAR) 50 MG tablet Take 1 tablet (50 mg total) by mouth daily.  90 tablet  0  . MYRBETRIQ 25 MG TB24 tablet Take 1 tablet by mouth daily.      . Tetrahydrozoline HCl (VISINE OP) Place 1 drop into both eyes daily as needed. Dry eyes        No current facility-administered medications for this visit.    Allergies  Allergen Reactions  . Penicillins Anaphylaxis  . Zolpidem Tartrate Anaphylaxis    History   Social History  . Marital Status: Married    Spouse Name: N/A    Number of Children: 3  . Years of Education: N/A   Occupational History  .  [employer]   Social History Main Topics  . Smoking status: Former Smoker    Quit date: 12/31/1996  . Smokeless tobacco: Not on file  . Alcohol Use: Yes  .  Drug Use: No  . Sexual Activity: Not on file   Other Topics Concern  . Not on file   Social History Narrative  . No narrative on file     Review of Systems: General: negative for chills, fever, night sweats or weight changes.  Cardiovascular: negative for chest pain, dyspnea on exertion, edema, orthopnea, palpitations, paroxysmal nocturnal dyspnea or shortness of breath Dermatological: negative for rash Respiratory: negative for cough or wheezing Urologic: negative for hematuria Abdominal: negative for nausea, vomiting, diarrhea, bright red blood per rectum, melena, or hematemesis Neurologic: negative for visual changes, syncope, or dizziness All other systems reviewed and are otherwise negative except as noted above.    Blood pressure 116/78, pulse 80, height 5\' 10"  (1.778 m), weight 98.703 kg (217 lb 9.6 oz).  General appearance: alert and no distress Neck: no adenopathy, no carotid bruit, no JVD, supple, symmetrical, trachea midline and thyroid not enlarged, symmetric, no tenderness/mass/nodules Lungs: clear to auscultation bilaterally Heart: regular rate and rhythm, S1, S2 normal, no murmur, click, rub or gallop Extremities: extremities normal, atraumatic, no cyanosis or edema  EKG normal sinus rhythm at 80 without ST or T wave changes  ASSESSMENT AND PLAN:   No problem-specific assessment & plan notes found for this encounter.      Lorretta Harp MD [place], [facility] 03/05/2014 2:52  PM

## 2014-03-31 ENCOUNTER — Other Ambulatory Visit: Payer: Self-pay

## 2014-03-31 ENCOUNTER — Telehealth: Payer: Self-pay | Admitting: Cardiovascular Disease

## 2014-03-31 MED ORDER — AMLODIPINE BESYLATE 5 MG PO TABS: 5.0000 mg | ORAL_TABLET | Freq: Every day | ORAL | Status: DC

## 2014-03-31 NOTE — Telephone Encounter (Signed)
Returned call.  Left message asking that hardy copy request be faxed to [phone] or send electronically as request has not been received.

## 2014-03-31 NOTE — Telephone Encounter (Signed)
Rx was sent to pharmacy electronically. 

## 2014-03-31 NOTE — Telephone Encounter (Signed)
Still have not heard from refill,need Amlodipine 5 mg #90 please.

## 2014-06-18 ENCOUNTER — Other Ambulatory Visit: Payer: Self-pay | Admitting: *Deleted

## 2014-06-18 MED ORDER — LOSARTAN POTASSIUM 50 MG PO TABS: 50.0000 mg | ORAL_TABLET | Freq: Every day | ORAL | Status: DC

## 2014-09-14 ENCOUNTER — Other Ambulatory Visit: Payer: Self-pay | Admitting: Dermatology

## 2014-09-29 ENCOUNTER — Other Ambulatory Visit: Payer: Self-pay

## 2014-09-29 MED ORDER — LOSARTAN POTASSIUM 50 MG PO TABS: 50.0000 mg | ORAL_TABLET | Freq: Every day | ORAL | Status: DC

## 2014-09-29 NOTE — Telephone Encounter (Signed)
Rx sent to pharmacy   

## 2014-11-01 ENCOUNTER — Other Ambulatory Visit: Payer: Self-pay | Admitting: *Deleted

## 2014-12-13 ENCOUNTER — Encounter: Payer: Self-pay | Admitting: Internal Medicine

## 2015-04-04 ENCOUNTER — Encounter: Payer: Self-pay | Admitting: Internal Medicine

## 2015-05-25 ENCOUNTER — Encounter: Payer: Self-pay | Admitting: Internal Medicine

## 2015-06-28 ENCOUNTER — Encounter: Payer: Self-pay | Admitting: *Deleted

## 2015-07-21 ENCOUNTER — Encounter: Payer: Self-pay | Admitting: Cardiovascular Disease

## 2015-08-01 ENCOUNTER — Encounter: Payer: Self-pay | Admitting: Cardiovascular Disease

## 2015-08-02 ENCOUNTER — Ambulatory Visit (AMBULATORY_SURGERY_CENTER): Payer: BLUE CROSS/BLUE SHIELD

## 2015-08-02 VITALS — Ht 70.0 in | Wt 181.0 lb

## 2015-08-02 DIAGNOSIS — Z860100 Personal history of colon polyps, unspecified: Secondary | ICD-10-CM

## 2015-08-02 NOTE — Progress Notes (Signed)
No allergies to eggs or soy No diet/weight loss meds No home oxygen No past problems with anesthesia  Refused Emmi instructions

## 2015-08-22 ENCOUNTER — Encounter: Payer: Self-pay | Admitting: Internal Medicine

## 2015-09-15 ENCOUNTER — Encounter: Payer: Self-pay | Admitting: Genetic Counselor

## 2015-09-15 DIAGNOSIS — Z1379 Encounter for other screening for genetic and chromosomal anomalies: Secondary | ICD-10-CM | POA: Insufficient documentation

## 2015-10-01 DIAGNOSIS — Z8719 Personal history of other diseases of the digestive system: Secondary | ICD-10-CM

## 2015-10-01 HISTORY — DX: Personal history of other diseases of the digestive system: Z87.19

## 2015-10-03 ENCOUNTER — Encounter: Payer: Self-pay | Admitting: Internal Medicine

## 2015-10-03 ENCOUNTER — Ambulatory Visit (AMBULATORY_SURGERY_CENTER): Payer: BLUE CROSS/BLUE SHIELD | Admitting: Internal Medicine

## 2015-10-03 VITALS — BP 101/66 | HR 61 | Temp 98.0°F | Resp 22 | Ht 70.0 in | Wt 181.0 lb

## 2015-10-03 DIAGNOSIS — Z86010 Personal history of colonic polyps: Secondary | ICD-10-CM | POA: Diagnosis not present

## 2015-10-03 DIAGNOSIS — D123 Benign neoplasm of transverse colon: Secondary | ICD-10-CM | POA: Diagnosis not present

## 2015-10-03 DIAGNOSIS — Z800 Family history of malignant neoplasm of digestive organs: Secondary | ICD-10-CM

## 2015-10-03 DIAGNOSIS — D125 Benign neoplasm of sigmoid colon: Secondary | ICD-10-CM | POA: Diagnosis not present

## 2015-10-03 DIAGNOSIS — D124 Benign neoplasm of descending colon: Secondary | ICD-10-CM

## 2015-10-03 HISTORY — DX: Family history of malignant neoplasm of digestive organs: Z80.0

## 2015-10-03 MED ORDER — SODIUM CHLORIDE 0.9 % IV SOLN: 500.0000 mL | INTRAVENOUS | Status: DC

## 2015-10-03 NOTE — Progress Notes (Signed)
Transferred to recovery room. A/O x3, pleased with MAC.  VSS.  Report to Jill, RN. 

## 2015-10-03 NOTE — Patient Instructions (Signed)
YOU HAD AN ENDOSCOPIC PROCEDURE TODAY AT THE [facility]:   Refer to the procedure report that was given to you for any specific questions about what was found during the examination.  If the procedure report does not answer your questions, please call your gastroenterologist to clarify.  If you requested that your care partner not be given the details of your procedure findings, then the procedure report has been included in a sealed envelope for you to review at your convenience later.  YOU SHOULD EXPECT: Some feelings of bloating in the abdomen. Passage of more gas than usual.  Walking can help get rid of the air that was put into your GI tract during the procedure and reduce the bloating. If you had a lower endoscopy (such as a colonoscopy or flexible sigmoidoscopy) you may notice spotting of blood in your stool or on the toilet paper. If you underwent a bowel prep for your procedure, you may not have a normal bowel movement for a few days.  Please Note:  You might notice some irritation and congestion in your nose or some drainage.  This is from the oxygen used during your procedure.  There is no need for concern and it should clear up in a day or so.  SYMPTOMS TO REPORT IMMEDIATELY:   Following lower endoscopy (colonoscopy or flexible sigmoidoscopy):  Excessive amounts of blood in the stool  Significant tenderness or worsening of abdominal pains  Swelling of the abdomen that is new, acute  Fever of 100F or higher   For urgent or emergent issues, a gastroenterologist can be reached at any hour by calling [phone].   DIET: Your first meal following the procedure should be a small meal and then it is ok to progress to your normal diet. Heavy or fried foods are harder to digest and may make you feel nauseous or bloated.  Likewise, meals heavy in dairy and vegetables can increase bloating.  Drink plenty of fluids but you should avoid alcoholic beverages for 24  hours.  ACTIVITY:  You should plan to take it easy for the rest of today and you should NOT DRIVE or use heavy machinery until tomorrow (because of the sedation medicines used during the test).    FOLLOW UP: Our staff will call the number listed on your records the next business day following your procedure to check on you and address any questions or concerns that you may have regarding the information given to you following your procedure. If we do not reach you, we will leave a message.  However, if you are feeling well and you are not experiencing any problems, there is no need to return our call.  We will assume that you have returned to your regular daily activities without incident.  If any biopsies were taken you will be contacted by phone or by letter within the next 1-3 weeks.  Please call us at [phone] if you have not heard about the biopsies in 3 weeks.    SIGNATURES/CONFIDENTIALITY: You and/or your care partner have signed paperwork which will be entered into your electronic medical record.  These signatures attest to the fact that that the information above on your After Visit Summary has been reviewed and is understood.  Full responsibility of the confidentiality of this discharge information lies with you and/or your care-partner. 

## 2015-10-03 NOTE — Op Note (Addendum)
[place]  [address] [place], [id number]   COLONOSCOPY PROCEDURE REPORT  PATIENT: Jesse Perez, Jesse Perez  MR#: [id number] BIRTHDATE: [birth year] , 61  yrs. old GENDER: male ENDOSCOPIST: Gatha Mayer, MD, [facility] PROCEDURE DATE:  10/03/2015 PROCEDURE:   Colonoscopy, surveillance , Colonoscopy with biopsy, and Colonoscopy with snare polypectomy First Screening Colonoscopy - Avg.  risk and is 50 yrs.  old or older - No.  Prior Negative Screening - Now for repeat screening. N/A  History of Adenoma - Now for follow-up colonoscopy & has been > or = to 3 yrs.  Yes hx of adenoma.  Has been 3 or more years since last colonoscopy.  Polyps removed today? Yes ASA CLASS:   Class II INDICATIONS:Surveillance due to prior colonic neoplasia and PH Colon Adenoma. MEDICATIONS: Propofol 450 mg IV and Monitored anesthesia care  DESCRIPTION OF PROCEDURE:   After the risks benefits and alternatives of the procedure were thoroughly explained, informed consent was obtained.  The digital rectal exam revealed no rectal mass.   The LB [id number]  endoscope was introduced through the anus and advanced to the cecum, which was identified by both the appendix and ileocecal valve. No adverse events experienced. The quality of the prep was good.  (MiraLax was used)  The instrument was then slowly withdrawn as the colon was fully examined. Estimated blood loss is zero unless otherwise noted in this procedure report.      COLON FINDINGS: Seven polypoid shaped sessile polyps ranging from 2 to 97m in size were found.  Polypectomies were performed with cold forceps (transverse) , with a cold snare (transverse x 3 and sigmoid x 1) and using snare cautery (transverse x 2).  The resection was complete, the polyp tissue was partially retrieved and sent to histology.   The examination was otherwise normal. Retroflexed views revealed no abnormalities. The time to cecum = 9.4 Withdrawal time =  24.9   The scope was withdrawn and the procedure completed. COMPLICATIONS: There were no immediate complications.  ENDOSCOPIC IMPRESSION: 1.   Seven sessile polyps ranging from 2 to 143min size were found; polypectomies were performed with cold forceps, with a cold snare and using snare cautery diminutive transverse polyps x 2 not recovered. 2.   The examination was otherwise normal with redundant colon  RECOMMENDATIONS: 1.  Hold Aspirin and all other NSAIDS for 2 weeks. 2.  Timing of repeat colonoscopy will be determined by pathology findings.  eSigned:  CaGatha MayerMD, FANeuropsychiatric Hospital Of [facility], LLC0/03/2015 2:24 PM Revised: 10/03/2015 2:24 PM  cc: the Patient and SyMuskog[facility] Va Medical CenterDr. KiJuventino Slovak  PATIENT NAME:  MiMerland, HolnessR#: [id number]

## 2015-10-03 NOTE — Progress Notes (Signed)
Called to room to assist during endoscopic procedure.  Patient ID and intended procedure confirmed with present staff. Received instructions for my participation in the procedure from the performing physician.  

## 2015-10-04 ENCOUNTER — Telehealth: Payer: Self-pay | Admitting: *Deleted

## 2015-10-04 NOTE — Telephone Encounter (Signed)
  Follow up Call-  Call back number 10/03/2015  Post procedure Call Back phone  # [phone]  Permission to leave phone message Yes     Patient questions:  Do you have a fever, pain , or abdominal swelling? No. Pain Score  0 *  Have you tolerated food without any problems? Yes.    Have you been able to return to your normal activities? Yes.    Do you have any questions about your discharge instructions: Diet   No. Medications  No. Follow up visit  No.  Do you have questions or concerns about your Care? No.  Actions: * If pain score is 4 or above: No action needed, pain <4.

## 2015-10-10 ENCOUNTER — Other Ambulatory Visit (INDEPENDENT_AMBULATORY_CARE_PROVIDER_SITE_OTHER): Payer: BLUE CROSS/BLUE SHIELD

## 2015-10-10 ENCOUNTER — Ambulatory Visit (INDEPENDENT_AMBULATORY_CARE_PROVIDER_SITE_OTHER): Payer: Self-pay | Admitting: Internal Medicine

## 2015-10-10 ENCOUNTER — Encounter: Payer: Self-pay | Admitting: Internal Medicine

## 2015-10-10 ENCOUNTER — Observation Stay (HOSPITAL_COMMUNITY)
Admission: AD | Admit: 2015-10-10 | Discharge: 2015-10-11 | Disposition: A | Discharge status: Home / Self Care | Payer: BLUE CROSS/BLUE SHIELD | Source: Ambulatory Visit | Attending: Internal Medicine | Admitting: Internal Medicine

## 2015-10-10 ENCOUNTER — Telehealth: Payer: Self-pay | Admitting: Internal Medicine

## 2015-10-10 ENCOUNTER — Encounter (HOSPITAL_COMMUNITY): Payer: Self-pay | Admitting: *Deleted

## 2015-10-10 VITALS — BP 116/64 | HR 68 | Ht 68.5 in | Wt 187.1 lb

## 2015-10-10 DIAGNOSIS — K648 Other hemorrhoids: Secondary | ICD-10-CM | POA: Diagnosis not present

## 2015-10-10 DIAGNOSIS — I10 Essential (primary) hypertension: Secondary | ICD-10-CM | POA: Diagnosis not present

## 2015-10-10 DIAGNOSIS — K633 Ulcer of intestine: Secondary | ICD-10-CM | POA: Insufficient documentation

## 2015-10-10 DIAGNOSIS — E119 Type 2 diabetes mellitus without complications: Secondary | ICD-10-CM | POA: Diagnosis not present

## 2015-10-10 DIAGNOSIS — K625 Hemorrhage of anus and rectum: Secondary | ICD-10-CM

## 2015-10-10 DIAGNOSIS — Y828 Other medical devices associated with adverse incidents: Secondary | ICD-10-CM | POA: Insufficient documentation

## 2015-10-10 DIAGNOSIS — Z880 Allergy status to penicillin: Secondary | ICD-10-CM | POA: Insufficient documentation

## 2015-10-10 DIAGNOSIS — K91840 Postprocedural hemorrhage and hematoma of a digestive system organ or structure following a digestive system procedure: Principal | ICD-10-CM | POA: Insufficient documentation

## 2015-10-10 DIAGNOSIS — Z87891 Personal history of nicotine dependence: Secondary | ICD-10-CM | POA: Diagnosis not present

## 2015-10-10 DIAGNOSIS — Z888 Allergy status to other drugs, medicaments and biological substances status: Secondary | ICD-10-CM | POA: Diagnosis not present

## 2015-10-10 DIAGNOSIS — Z79899 Other long term (current) drug therapy: Secondary | ICD-10-CM | POA: Insufficient documentation

## 2015-10-10 DIAGNOSIS — K922 Gastrointestinal hemorrhage, unspecified: Secondary | ICD-10-CM

## 2015-10-10 DIAGNOSIS — Z86010 Personal history of colonic polyps: Secondary | ICD-10-CM | POA: Diagnosis not present

## 2015-10-10 DIAGNOSIS — Z8546 Personal history of malignant neoplasm of prostate: Secondary | ICD-10-CM | POA: Diagnosis not present

## 2015-10-10 DIAGNOSIS — K921 Melena: Secondary | ICD-10-CM | POA: Insufficient documentation

## 2015-10-10 DIAGNOSIS — G4733 Obstructive sleep apnea (adult) (pediatric): Secondary | ICD-10-CM | POA: Insufficient documentation

## 2015-10-10 DIAGNOSIS — Y848 Other medical procedures as the cause of abnormal reaction of the patient, or of later complication, without mention of misadventure at the time of the procedure: Secondary | ICD-10-CM | POA: Diagnosis not present

## 2015-10-10 DIAGNOSIS — Z800 Family history of malignant neoplasm of digestive organs: Secondary | ICD-10-CM | POA: Diagnosis not present

## 2015-10-10 HISTORY — DX: Family history of malignant neoplasm of digestive organs: Z80.0

## 2015-10-10 LAB — PROTIME-INR
INR: 1.02 (ref 0.00–1.49)
Prothrombin Time: 13.6 seconds (ref 11.6–15.2)

## 2015-10-10 LAB — CBC WITH DIFFERENTIAL/PLATELET
BASOS PCT: 0.4 % (ref 0.0–3.0)
Basophils Absolute: 0 10*3/uL (ref 0.0–0.1)
EOS ABS: 0.6 10*3/uL (ref 0.0–0.7)
Eosinophils Relative: 7.4 % — ABNORMAL HIGH (ref 0.0–5.0)
HEMATOCRIT: 35.9 % — AB (ref 39.0–52.0)
HEMOGLOBIN: 12.1 g/dL — AB (ref 13.0–17.0)
LYMPHS PCT: 34.3 % (ref 12.0–46.0)
Lymphs Abs: 2.6 10*3/uL (ref 0.7–4.0)
MCHC: 33.7 g/dL (ref 30.0–36.0)
MCV: 85.5 fl (ref 78.0–100.0)
MONOS PCT: 6.6 % (ref 3.0–12.0)
Monocytes Absolute: 0.5 10*3/uL (ref 0.1–1.0)
NEUTROS ABS: 3.9 10*3/uL (ref 1.4–7.7)
Neutrophils Relative %: 51.3 % (ref 43.0–77.0)
PLATELETS: 228 10*3/uL (ref 150.0–400.0)
RBC: 4.2 Mil/uL — ABNORMAL LOW (ref 4.22–5.81)
RDW: 13.5 % (ref 11.5–15.5)
WBC: 7.5 10*3/uL (ref 4.0–10.5)

## 2015-10-10 LAB — COMPREHENSIVE METABOLIC PANEL
ALT: 30 U/L (ref 17–63)
AST: 31 U/L (ref 15–41)
Albumin: 4.5 g/dL (ref 3.5–5.0)
Alkaline Phosphatase: 53 U/L (ref 38–126)
Anion gap: 5 (ref 5–15)
BUN: 20 mg/dL (ref 6–20)
CHLORIDE: 111 mmol/L (ref 101–111)
CO2: 26 mmol/L (ref 22–32)
Calcium: 9.1 mg/dL (ref 8.9–10.3)
Creatinine, Ser: 0.97 mg/dL (ref 0.61–1.24)
Glucose, Bld: 93 mg/dL (ref 65–99)
POTASSIUM: 4.9 mmol/L (ref 3.5–5.1)
SODIUM: 142 mmol/L (ref 135–145)
Total Bilirubin: 0.9 mg/dL (ref 0.3–1.2)
Total Protein: 6.5 g/dL (ref 6.5–8.1)

## 2015-10-10 LAB — CBC
HEMATOCRIT: 37.1 % — AB (ref 39.0–52.0)
Hemoglobin: 12.5 g/dL — ABNORMAL LOW (ref 13.0–17.0)
MCH: 29.4 pg (ref 26.0–34.0)
MCHC: 33.7 g/dL (ref 30.0–36.0)
MCV: 87.3 fL (ref 78.0–100.0)
Platelets: 230 10*3/uL (ref 150–400)
RBC: 4.25 MIL/uL (ref 4.22–5.81)
RDW: 13.6 % (ref 11.5–15.5)
WBC: 8 10*3/uL (ref 4.0–10.5)

## 2015-10-10 MED ORDER — LOSARTAN POTASSIUM 50 MG PO TABS
50.0000 mg | ORAL_TABLET | Freq: Every day | ORAL | Status: DC
  Administered 2015-10-11: 50 mg via ORAL
  Filled 2015-10-10: qty 1

## 2015-10-10 MED ORDER — ALUM & MAG HYDROXIDE-SIMETH 200-200-20 MG/5ML PO SUSP: 30.0000 mL | Freq: Four times a day (QID) | ORAL | Status: DC | PRN

## 2015-10-10 MED ORDER — ACETAMINOPHEN 325 MG PO TABS: 650.0000 mg | ORAL_TABLET | Freq: Four times a day (QID) | ORAL | Status: DC | PRN

## 2015-10-10 MED ORDER — SODIUM CHLORIDE 0.9 % IJ SOLN: 3.0000 mL | INTRAMUSCULAR | Status: DC | PRN

## 2015-10-10 MED ORDER — PEG-KCL-NACL-NASULF-NA ASC-C 100 G PO SOLR
0.5000 | Freq: Once | ORAL | Status: AC
  Administered 2015-10-10: 0.5 via ORAL
  Filled 2015-10-10 (×3): qty 1

## 2015-10-10 MED ORDER — ACETAMINOPHEN 650 MG RE SUPP: 650.0000 mg | Freq: Four times a day (QID) | RECTAL | Status: DC | PRN

## 2015-10-10 MED ORDER — TRAZODONE HCL 50 MG PO TABS: 25.0000 mg | ORAL_TABLET | Freq: Every evening | ORAL | Status: DC | PRN

## 2015-10-10 MED ORDER — METOCLOPRAMIDE HCL 5 MG/ML IJ SOLN
10.0000 mg | Freq: Once | INTRAMUSCULAR | Status: AC
  Administered 2015-10-11: 10 mg via INTRAVENOUS
  Filled 2015-10-10: qty 2

## 2015-10-10 MED ORDER — ONDANSETRON HCL 4 MG/2ML IJ SOLN: 4.0000 mg | Freq: Four times a day (QID) | INTRAMUSCULAR | Status: DC | PRN

## 2015-10-10 MED ORDER — AMLODIPINE BESYLATE 5 MG PO TABS
5.0000 mg | ORAL_TABLET | Freq: Every day | ORAL | Status: DC
  Administered 2015-10-11: 5 mg via ORAL
  Filled 2015-10-10: qty 1

## 2015-10-10 MED ORDER — SODIUM CHLORIDE 0.9 % IV SOLN: 250.0000 mL | INTRAVENOUS | Status: DC | PRN

## 2015-10-10 MED ORDER — PEG-KCL-NACL-NASULF-NA ASC-C 100 G PO SOLR
0.5000 | Freq: Once | ORAL | Status: AC
  Administered 2015-10-11: 0.5 via ORAL

## 2015-10-10 MED ORDER — PEG-KCL-NACL-NASULF-NA ASC-C 100 G PO SOLR
1.0000 | Freq: Once | ORAL | Status: DC
Start: 2015-10-10 — End: 2015-10-10

## 2015-10-10 MED ORDER — HYDROCODONE-ACETAMINOPHEN 5-325 MG PO TABS: 1.0000 | ORAL_TABLET | ORAL | Status: DC | PRN

## 2015-10-10 MED ORDER — SODIUM CHLORIDE 0.9 % IV SOLN
INTRAVENOUS | Status: DC
  Administered 2015-10-10 – 2015-10-11 (×3): via INTRAVENOUS

## 2015-10-10 MED ORDER — SODIUM CHLORIDE 0.9 % IV BOLUS (SEPSIS)
1000.0000 mL | Freq: Once | INTRAVENOUS | Status: AC
  Administered 2015-10-10: 1000 mL via INTRAVENOUS

## 2015-10-10 MED ORDER — ONDANSETRON HCL 4 MG PO TABS: 4.0000 mg | ORAL_TABLET | Freq: Four times a day (QID) | ORAL | Status: DC | PRN

## 2015-10-10 MED ORDER — SODIUM CHLORIDE 0.9 % IJ SOLN: 3.0000 mL | Freq: Two times a day (BID) | INTRAMUSCULAR | Status: DC

## 2015-10-10 MED ORDER — METOCLOPRAMIDE HCL 5 MG/ML IJ SOLN
10.0000 mg | Freq: Once | INTRAMUSCULAR | Status: AC
  Administered 2015-10-10: 10 mg via INTRAVENOUS
  Filled 2015-10-10: qty 2

## 2015-10-10 NOTE — Progress Notes (Signed)
  Patient was seen and evaluated in the office and then he was admitted to observation in the hospital. Please see that note for details. He has had bleeding after colonoscopy. Polypectomy had been performed last week. He will be evaluated and treated at the hospital.

## 2015-10-10 NOTE — H&P (Signed)
[facility] History and Physical   Primary Care Physician:  Pauline Good, MD   Reason for admission: Lower GI Bleeding after colonoscopy and polypectomy, mild acute blood loss anemia  Plan:    Admit to observation, serial hemoglobins, IV fluids, colonoscopy to evaluate for cause of bleeding and to treat if possible. Dr. Leslee Home will perform the procedure tomorrow. Of explained the risks benefits and indications of this to the patient and the understands and agrees to proceed.     HPI: Jesse Perez is a 64 y.o. male with a history of colon polyps and status post most recent colonoscopy 10/03/2015. I removed 7 polyps maximum size 10 mm. Cold biopsy, cold snare and snare cautery polypectomy were used. 2-3 days after the colonoscopy passing some blood. Bright red or maroon blood. Only with bowel movements and not in between. He observed that he did not feel poorly. It persisted anywhere from 1-3 times a day, he's had it twice today we called the office where he presented and hemoglobin was found to be 12 and on exam he had a maroon stool on rectal. He has felt okay even went to the gym and worked out. No lightheadedness or chest pain etc. He does not and has not used anti-inflammatory's or any type of anticoagulates.  Note that 6 of the 7 polyps are adenomatous. I have not yet discussed that with the patient. Past Medical History  Diagnosis Date  . Hypertension   . Diabetes mellitus     no meds  . Prostate cancer ([place]) 2011    Gleason score=3  . Hemochromatosis carrier   . Obstructive sleep apnea     does not use CPAP  . Bradycardia   . Family history of colon cancer - grandfather and CHEK mutation increasing colon cancer risk 10/03/2015    Past Surgical History  Procedure Laterality Date  . Back surgery    . Prostate surgery    . Appendectomy    . US echocardiography  09/05/2011    borderline aortic root dilatation  . Colonoscopy      Prior to Admission  medications   Medication Sig Start Date End Date Taking? Authorizing Provider  amLODipine (NORVASC) 5 MG tablet Take 1 tablet (5 mg total) by mouth daily. 03/31/14  Yes Lorretta Harp, MD  loratadine (CLARITIN) 10 MG tablet Take 10 mg by mouth daily as needed for allergies.    Yes Historical Provider, MD  losartan (COZAAR) 50 MG tablet Take 1 tablet (50 mg total) by mouth daily. 09/29/14  Yes Lorretta Harp, MD  MYRBETRIQ 25 MG TB24 tablet Take 1 tablet by mouth daily. 03/01/14  Yes Historical Provider, MD  naproxen sodium (ANAPROX) 220 MG tablet Take 440 mg by mouth daily as needed (pain).   Yes Historical Provider, MD  oxymetazoline (AFRIN) 0.05 % nasal spray Place 1 spray into both nostrils 2 (two) times daily as needed for congestion.   Yes Historical Provider, MD  sodium chloride (OCEAN) 0.65 % SOLN nasal spray Place 1 spray into both nostrils as needed for congestion.   Yes Historical Provider, MD    Current Facility-Administered Medications  Medication Dose Route Frequency Provider Last Rate Last Dose  . 0.9 %  sodium chloride infusion  250 mL Intravenous PRN Lori P Hvozdovic, PA-C      . 0.9 %  sodium chloride infusion   Intravenous Continuous Lori P Hvozdovic, PA-C      . acetaminophen (TYLENOL) tablet 650 mg  650  mg Oral Q6H PRN Lori P Hvozdovic, PA-C       Or  . acetaminophen (TYLENOL) suppository 650 mg  650 mg Rectal Q6H PRN Lori P Hvozdovic, PA-C      . alum & mag hydroxide-simeth (MAALOX/MYLANTA) 200-200-20 MG/5ML suspension 30 mL  30 mL Oral Q6H PRN Lori P Hvozdovic, PA-C      . [START ON 10/11/2015] amLODipine (NORVASC) tablet 5 mg  5 mg Oral Daily Lori P Hvozdovic, PA-C      . HYDROcodone-acetaminophen (NORCO/VICODIN) 5-325 MG per tablet 1-2 tablet  1-2 tablet Oral Q4H PRN Lori P Hvozdovic, PA-C      . [START ON 10/11/2015] losartan (COZAAR) tablet 50 mg  50 mg Oral Daily Lori P Hvozdovic, PA-C      . ondansetron (ZOFRAN) tablet 4 mg  4 mg Oral Q6H PRN Lori P Hvozdovic, PA-C        Or  . ondansetron (ZOFRAN) injection 4 mg  4 mg Intravenous Q6H PRN Lori P Hvozdovic, PA-C      . peg 3350 powder (MOVIPREP) kit 100 g  0.5 kit Oral Once Lori P Hvozdovic, PA-C       And  . [START ON 10/11/2015] peg 3350 powder (MOVIPREP) kit 100 g  0.5 kit Oral Once Lori P Hvozdovic, PA-C      . sodium chloride 0.9 % injection 3 mL  3 mL Intravenous Q12H Lori P Hvozdovic, PA-C      . sodium chloride 0.9 % injection 3 mL  3 mL Intravenous PRN Lori P Hvozdovic, PA-C      . traZODone (DESYREL) tablet 25 mg  25 mg Oral QHS PRN Lori P Hvozdovic, PA-C        Allergies as of 10/10/2015 - Review Complete 10/10/2015  Allergen Reaction Noted  . Penicillins Anaphylaxis 11/09/2009  . Zolpidem tartrate Anaphylaxis 08/27/2011    Family History  Problem Relation Age of Onset  . Breast cancer Mother 44  . Cancer Mother   . Hypertension Father   . Stroke Father   . Breast cancer Sister 89    BRCA neg  . Breast cancer Maternal Grandmother     dx in her 94s  . Breast cancer Daughter 70    ATM and CHEK2 carrier  . Hemochromatosis Daughter 39    C282Y and S65C double heterozygote  . Colon polyps Neg Hx   . Crohn's disease Neg Hx   . Liver cancer Neg Hx   . Pancreatic cancer Neg Hx   . Rectal cancer Neg Hx   . Stomach cancer Neg Hx   . Ulcerative colitis Neg Hx   . Colon cancer Paternal Grandfather     Social History   Social History  . Marital Status: Married    Spouse Name: N/A  . Number of Children: 3  . Years of Education: N/A   Occupational History  .  [employer]   Social History Main Topics  . Smoking status: Former Smoker    Quit date: 12/31/1996  . Smokeless tobacco: Never Used  . Alcohol Use: 0.0 oz/week    0 Standard drinks or equivalent per week     Comment: weekends only  . Drug Use: No  . Sexual Activity: Not on file   Other Topics Concern  . Not on file   Social History Narrative    Review of Systems:  All other review of systems negative except as  mentioned in the HPI.  Physical Exam: Vital signs in last 24  hours: Temp:  [97.9 F (36.6 C)] 97.9 F (36.6 C) (10/10 1707) Pulse Rate:  [63-68] 63 (10/10 1707) Resp:  [18] 18 (10/10 1707) BP: (116-130)/(64-69) 130/69 mmHg (10/10 1707) SpO2:  [98 %] 98 % (10/10 1707) Weight:  [187 lb 2 oz (84.879 kg)] 187 lb 2 oz (84.879 kg) (10/10 1524) Last BM Date: 10/10/15 General:   Alert,  Well-developed, well-nourished, pleasant and cooperative in NAD Lungs:  Clear throughout to auscultation.   Heart:  Regular rate and rhythm; no murmurs, clicks, rubs,  or gallops. Rectal exam reveals maroon stool no mass Abdomen:  Soft, nontender and nondistended. Normal bowel sounds.   Neuro/Psych:  Alert and cooperative. Normal mood and affect. A and O x 3   _0  E. Carlean Purl, MD, [facility] [phone] (pager) 10/10/2015 5:49 PM@

## 2015-10-10 NOTE — Telephone Encounter (Signed)
Patient had a colonoscopy last week with polypectomy x7.  Had his first BM last Thursday and 2 were normal, since all BM are bloody.  He reports stools are loose with dark blood.  He is not passing blood independent of stool. Denies abdominal pain, nausea, vomiting, cramping, dizziness, CP or SOB.  "I feel fine".  Dr. Carlean Purl please advise

## 2015-10-10 NOTE — Telephone Encounter (Signed)
CBC See me end of day

## 2015-10-10 NOTE — Patient Instructions (Signed)
  After you go home to gather some things please proceed to [facility] .   Dr Carlean Purl has got you a room on [address] there.    I appreciate the opportunity to care for you. Silvano Rusk, RN, [facility]

## 2015-10-10 NOTE — Telephone Encounter (Signed)
Patient notified He will come now for CBC and office visit asap.  He is aware to go to the lab first then come to the 3rd floor

## 2015-10-11 ENCOUNTER — Other Ambulatory Visit: Payer: Self-pay | Admitting: Physician Assistant

## 2015-10-11 ENCOUNTER — Observation Stay (HOSPITAL_COMMUNITY): Payer: BLUE CROSS/BLUE SHIELD | Admitting: Anesthesiology

## 2015-10-11 ENCOUNTER — Encounter (HOSPITAL_COMMUNITY)
Admission: AD | Disposition: A | Discharge status: Home / Self Care | Payer: Self-pay | Source: Ambulatory Visit | Attending: Internal Medicine

## 2015-10-11 ENCOUNTER — Encounter (HOSPITAL_COMMUNITY): Payer: Self-pay

## 2015-10-11 DIAGNOSIS — K633 Ulcer of intestine: Secondary | ICD-10-CM

## 2015-10-11 DIAGNOSIS — K91840 Postprocedural hemorrhage and hematoma of a digestive system organ or structure following a digestive system procedure: Secondary | ICD-10-CM | POA: Diagnosis not present

## 2015-10-11 DIAGNOSIS — K625 Hemorrhage of anus and rectum: Secondary | ICD-10-CM

## 2015-10-11 DIAGNOSIS — K921 Melena: Secondary | ICD-10-CM

## 2015-10-11 HISTORY — PX: COLONOSCOPY WITH PROPOFOL: SHX5780

## 2015-10-11 LAB — CBC
HCT: 34.2 % — ABNORMAL LOW (ref 39.0–52.0)
HEMOGLOBIN: 11.5 g/dL — AB (ref 13.0–17.0)
MCH: 29.3 pg (ref 26.0–34.0)
MCHC: 33.6 g/dL (ref 30.0–36.0)
MCV: 87.2 fL (ref 78.0–100.0)
PLATELETS: 194 10*3/uL (ref 150–400)
RBC: 3.92 MIL/uL — AB (ref 4.22–5.81)
RDW: 13.6 % (ref 11.5–15.5)
WBC: 6.4 10*3/uL (ref 4.0–10.5)

## 2015-10-11 LAB — BASIC METABOLIC PANEL
ANION GAP: 5 (ref 5–15)
BUN: 13 mg/dL (ref 6–20)
CHLORIDE: 113 mmol/L — AB (ref 101–111)
CO2: 23 mmol/L (ref 22–32)
Calcium: 8.5 mg/dL — ABNORMAL LOW (ref 8.9–10.3)
Creatinine, Ser: 0.84 mg/dL (ref 0.61–1.24)
GFR calc Af Amer: >60 mL/min (ref >60)
GLUCOSE: 92 mg/dL (ref 65–99)
POTASSIUM: 3.9 mmol/L (ref 3.5–5.1)
SODIUM: 141 mmol/L (ref 135–145)

## 2015-10-11 SURGERY — COLONOSCOPY WITH PROPOFOL
Anesthesia: Monitor Anesthesia Care

## 2015-10-11 MED ORDER — LACTATED RINGERS IV SOLN
INTRAVENOUS | Status: DC | PRN
  Administered 2015-10-11: 12:00:00 via INTRAVENOUS

## 2015-10-11 MED ORDER — SODIUM CHLORIDE 0.9 % IV SOLN: INTRAVENOUS | Status: DC

## 2015-10-11 MED ORDER — PROPOFOL 10 MG/ML IV BOLUS
INTRAVENOUS | Status: AC
  Filled 2015-10-11: qty 20

## 2015-10-11 MED ORDER — PROPOFOL 10 MG/ML IV BOLUS
INTRAVENOUS | Status: DC | PRN
  Administered 2015-10-11: 30 mg via INTRAVENOUS
  Administered 2015-10-11: 20 mg via INTRAVENOUS
  Administered 2015-10-11: 30 mg via INTRAVENOUS
  Administered 2015-10-11 (×5): 20 mg via INTRAVENOUS
  Administered 2015-10-11: 30 mg via INTRAVENOUS
  Administered 2015-10-11 (×3): 20 mg via INTRAVENOUS
  Administered 2015-10-11: 30 mg via INTRAVENOUS
  Administered 2015-10-11: 20 mg via INTRAVENOUS
  Administered 2015-10-11: 40 mg via INTRAVENOUS
  Administered 2015-10-11 (×2): 20 mg via INTRAVENOUS

## 2015-10-11 MED ORDER — PROPOFOL 500 MG/50ML IV EMUL: INTRAVENOUS | Status: DC | PRN

## 2015-10-11 MED ORDER — PROMETHAZINE HCL 25 MG/ML IJ SOLN: 6.2500 mg | INTRAMUSCULAR | Status: DC | PRN

## 2015-10-11 SURGICAL SUPPLY — 22 items

## 2015-10-11 NOTE — Anesthesia Postprocedure Evaluation (Signed)
  Anesthesia Post-op Note  Patient: Jesse Perez  Procedure(s) Performed: Procedure(s) (LRB): COLONOSCOPY WITH PROPOFOL (N/A)  Patient Location: PACU  Anesthesia Type: MAC  Level of Consciousness: awake and alert   Airway and Oxygen Therapy: Patient Spontanous Breathing  Post-op Pain: mild  Post-op Assessment: Post-op Vital signs reviewed, Patient's Cardiovascular Status Stable, Respiratory Function Stable, Patent Airway and No signs of Nausea or vomiting  Last Vitals:  Filed Vitals:   10/11/15 1245  BP:   Pulse: 67  Temp:   Resp: 16    Post-op Vital Signs: stable   Complications: No apparent anesthesia complications

## 2015-10-11 NOTE — Transfer of Care (Signed)
Immediate Anesthesia Transfer of Care Note  Patient: Jesse Perez  Procedure(s) Performed: Procedure(s): COLONOSCOPY WITH PROPOFOL (N/A)  Patient Location: PACU  Anesthesia Type:MAC  Level of Consciousness: sedated  Airway & Oxygen Therapy: Patient Spontanous Breathing and Patient connected to nasal cannula oxygen  Post-op Assessment: Report given to RN and Post -op Vital signs reviewed and stable  Post vital signs: Reviewed and stable  Last Vitals:  Filed Vitals:   10/11/15 1201  BP: 146/87  Pulse: 60  Temp: 36.5 C  Resp: 10    Complications: No apparent anesthesia complications

## 2015-10-11 NOTE — Anesthesia Preprocedure Evaluation (Signed)
Anesthesia Evaluation  Patient identified by MRN, date of birth, ID band Patient awake    Reviewed: Allergy & Precautions, NPO status , Patient's Chart, lab work & pertinent test results  Airway Mallampati: II  TM Distance: >3 FB Neck ROM: Full    Dental no notable dental hx.    Pulmonary sleep apnea , former smoker,    Pulmonary exam normal breath sounds clear to auscultation       Cardiovascular hypertension, Pt. on medications Normal cardiovascular exam Rhythm:Regular Rate:Normal     Neuro/Psych negative neurological ROS  negative psych ROS   GI/Hepatic negative GI ROS, Neg liver ROS,   Endo/Other  negative endocrine ROS  Renal/GU negative Renal ROS  negative genitourinary   Musculoskeletal negative musculoskeletal ROS (+)   Abdominal   Peds negative pediatric ROS (+)  Hematology negative hematology ROS (+)   Anesthesia Other Findings   Reproductive/Obstetrics negative OB ROS                             Anesthesia Physical Anesthesia Plan  ASA: II  Anesthesia Plan: MAC   Post-op Pain Management:    Induction: Intravenous  Airway Management Planned: Nasal Cannula  Additional Equipment:   Intra-op Plan:   Post-operative Plan:   Informed Consent: I have reviewed the patients History and Physical, chart, labs and discussed the procedure including the risks, benefits and alternatives for the proposed anesthesia with the patient or authorized representative who has indicated his/her understanding and acceptance.   Dental advisory given  Plan Discussed with: CRNA and Surgeon  Anesthesia Plan Comments:         Anesthesia Quick Evaluation

## 2015-10-11 NOTE — Progress Notes (Signed)
Tolerated prep. Hgb 11.5 this morning. For colonoscopy later today. L. Hvozdovic, PA-C [facility]  GI ATTENDING  Interval history data reviewed. Prior colonoscopy report reviewed. For colonoscopy today.  Docia Chuck. Geri Seminole., M.D. [facility] Division of Gastroenterology

## 2015-10-11 NOTE — Op Note (Signed)
[facility] [place], [id number]   COLONOSCOPY PROCEDURE REPORT  PATIENT: Jesse Perez, Jesse Perez  MR#: [id number] BIRTHDATE: 05/19/1951 , 53  yrs. old GENDER: male ENDOSCOPIST: Eustace Quail, MD REFERRED KK:XFGH Simonne Maffucci, M.D, [facility] PROCEDURE DATE:  10/11/2015 PROCEDURE:   Colonoscopy, diagnostic  ASA CLASS:   Class II INDICATIONS:hematochezia. . Colonoscopy with multiple polypectomy (2 polyps hot snare) 10/03/2015. Pathology shows multiple adenomas. Has been oozing intermittently. Admitted for relook colonoscopy MEDICATIONS: Monitored anesthesia care and Per Anesthesia  DESCRIPTION OF PROCEDURE:   After the risks benefits and alternatives of the procedure were thoroughly explained, informed consent was obtained.  The digital rectal exam revealed no abnormalities of the rectum.   The Pentax Ped Colon [id number] endoscope was introduced through the anus and advanced to the cecum, which was identified by both the appendix and ileocecal valve. No adverse events experienced.   The quality of the prep was fair.  (MoviPrep was used)  The instrument was then slowly withdrawn as the colon was fully examined. Estimated blood loss is zero unless otherwise noted in this procedure report.    COLON FINDINGS: The colonoscope was advanced to the cecal tip. Colonic preparation was fair with turbid stool and vegetative material.  No active bleeding, blood, or blood clots in the colon. Prior hot snare polypectomy site was identified with clean based ulcer.  Other polypectomy site not identified.  No other abnormalities.  Retroflexed views revealed internal hemorrhoids. The time to cecum = 10.1 Withdrawal time = 7.6   The scope was withdrawn and the procedure completed. COMPLICATIONS: There were no immediate complications.  ENDOSCOPIC IMPRESSION: 1. Post polypectomy bleed. 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 No further  bleeding 2. Multiple adenomas on colonoscopy 10/03/2015. Patient and wife informed of pathology  RECOMMENDATIONS: 1. Colonoscopy  follow-up per Dr. Celesta Aver recommendation 2. Okay to go home later today.  eSigned:  Eustace Quail, MD 10/11/2015 12:54 PM   cc: The Patient, Silvano Rusk, MD, and Ihor Gully, MD

## 2015-10-11 NOTE — Discharge Summary (Signed)
[facility] Discharge Summary  Name: Jesse Perez MRN: [id number] DOB: [birth year] 64 y.o. PCP:  Pauline Good, MD  Date of Admission: 10/10/2015  4:42 PM Date of Discharge: 10/11/2015 Primary Gastroenterologist:Dr Carlean Purl Discharging Physician:Dr Henrene Pastor  Discharge Diagnosis: Active Problems:   GI bleed   Hematochezia   Colonic ulcer   Consultationsnon   GI Procedures: Colonoscopy 10/11/15   History/Physical Exam:  See Admission H&P  Admission HPI:  10/10/15: Jesse Perez is a 64 y.o. male with a history of colon polyps and status post most recent colonoscopy 10/03/2015. I removed 7 polyps maximum size 10 mm. Cold biopsy, cold snare and snare cautery polypectomy were used. 2-3 days after the colonoscopy passing some blood. Bright red or maroon blood. Only with bowel movements and not in between. He observed that he did not feel poorly. It persisted anywhere from 1-3 times a day, he's had it twice today we called the office where he presented and hemoglobin was found to be 12 and on exam he had a maroon stool on rectal. He has felt okay even went to the gym and worked out. No lightheadedness or chest pain etc. He does not and has not used anti-inflammatory's or any type of anticoagulates.  Hospital Course by problem list: Active Problems:   GI bleed   Hematochezia   Colonic ulcer  patient was admitted on the 10th 2 surgical floor for observation. He was given IV fluids and was prepped for a colonoscopy. He underwent a colonoscopy on 10/11/2015 with Dr. Henrene Pastor ENDOSCOPIC IMPRESSION: 1. Post polypectomy bleed. 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 No further bleeding 2. Multiple adenomas on colonoscopy 10/03/2015. Patient and wife informed of pathology RECOMMENDATIONS: 1. Colonoscopy follow-up per Dr. Celesta Aver recommendation 2. Okay to go home later today.  Discharge Vitals:  BP 136/77 mmHg  Pulse 65   Temp(Src) 97 F (36.1 C) (Oral)  Resp 17  Ht '5\' 10"'$  (1.778 m)  Wt 183 lb 14.4 oz (83.416 kg)  BMI 26.39 kg/m2  SpO2 99%  Physical Exam  General: Alert and oriented 4, no distress Cardiac: Regular rate and rhythm S1 and S2 Pulmonary: Lungs clear Abdominal: Soft nontender positive bowel sounds Extremity: No cyanosis clubbing or edema Neuro: Alert and oriented, no obvious deficits Psych: Alert and oriented, affect appropriate  Discharge Labs:  Results for orders placed or performed during the hospital encounter of 10/10/15 (from the past 24 hour(s))  Comprehensive metabolic panel     Status: None   Collection Time: 10/10/15  5:04 PM  Result Value Ref Range   Sodium 142 135 - 145 mmol/L   Potassium 4.9 3.5 - 5.1 mmol/L   Chloride 111 101 - 111 mmol/L   CO2 26 22 - 32 mmol/L   Glucose, Bld 93 65 - 99 mg/dL   BUN 20 6 - 20 mg/dL   Creatinine, Ser 0.97 0.61 - 1.24 mg/dL   Calcium 9.1 8.9 - 10.3 mg/dL   Total Protein 6.5 6.5 - 8.1 g/dL   Albumin 4.5 3.5 - 5.0 g/dL   AST 31 15 - 41 U/L   ALT 30 17 - 63 U/L   Alkaline Phosphatase 53 38 - 126 U/L   Total Bilirubin 0.9 0.3 - 1.2 mg/dL   GFR calc non Af Amer >60 >60 mL/min   GFR calc Af Amer >60 >60 mL/min   Anion gap 5 5 - 15  CBC     Status: Abnormal   Collection Time: 10/10/15  5:04 PM  Result Value Ref Range   WBC 8.0 4.0 - 10.5 K/uL   RBC 4.25 4.22 - 5.81 MIL/uL   Hemoglobin 12.5 (L) 13.0 - 17.0 g/dL   HCT 37.1 (L) 39.0 - 52.0 %   MCV 87.3 78.0 - 100.0 fL   MCH 29.4 26.0 - 34.0 pg   MCHC 33.7 30.0 - 36.0 g/dL   RDW 13.6 11.5 - 15.5 %   Platelets 230 150 - 400 K/uL  Protime-INR     Status: None   Collection Time: 10/10/15  5:04 PM  Result Value Ref Range   Prothrombin Time 13.6 11.6 - 15.2 seconds   INR 1.02 0.00 - 1.49  CBC     Status: Abnormal   Collection Time: 10/11/15  6:26 AM  Result Value Ref Range   WBC 6.4 4.0 - 10.5 K/uL   RBC 3.92 (L) 4.22 - 5.81 MIL/uL   Hemoglobin 11.5 (L) 13.0 - 17.0 g/dL   HCT 34.2  (L) 39.0 - 52.0 %   MCV 87.2 78.0 - 100.0 fL   MCH 29.3 26.0 - 34.0 pg   MCHC 33.6 30.0 - 36.0 g/dL   RDW 13.6 11.5 - 15.5 %   Platelets 194 150 - 400 K/uL  Basic metabolic panel     Status: Abnormal   Collection Time: 10/11/15  6:26 AM  Result Value Ref Range   Sodium 141 135 - 145 mmol/L   Potassium 3.9 3.5 - 5.1 mmol/L   Chloride 113 (H) 101 - 111 mmol/L   CO2 23 22 - 32 mmol/L   Glucose, Bld 92 65 - 99 mg/dL   BUN 13 6 - 20 mg/dL   Creatinine, Ser 0.84 0.61 - 1.24 mg/dL   Calcium 8.5 (L) 8.9 - 10.3 mg/dL   GFR calc non Af Amer >60 >60 mL/min   GFR calc Af Amer >60 >60 mL/min   Anion gap 5 5 - 15    Disposition and follow-up:   [name] was discharged from [facility] in stable condition.    Follow-up Appointments: Discharge Instructions    Call MD for:  redness, tenderness, or signs of infection (pain, swelling, bleeding, redness, odor or green/yellow discharge around incision site)    Complete by:  As directed      Call MD for:  severe or increased pain, loss or decreased feeling  in affected limb(s)    Complete by:  As directed      Call MD for:  temperature >100.5    Complete by:  As directed      Diet - low sodium heart healthy    Complete by:  As directed      Increase activity slowly    Complete by:  As directed   Walk with assistance use walker or cane as needed     Increase activity slowly    Complete by:  As directed      Resume previous diet    Complete by:  As directed            Discharge Medications:   Medication List    STOP taking these medications        naproxen sodium 220 MG tablet  Commonly known as:  ANAPROX     oxymetazoline 0.05 % nasal spray  Commonly known as:  AFRIN     sodium chloride 0.65 % Soln nasal spray  Commonly known as:  OCEAN      TAKE these medications  amLODipine 5 MG tablet  Commonly known as:  NORVASC  Take 1 tablet (5 mg total) by mouth daily.     loratadine 10 MG tablet   Commonly known as:  CLARITIN  Take 10 mg by mouth daily as needed for allergies.     losartan 50 MG tablet  Commonly known as:  COZAAR  Take 1 tablet (50 mg total) by mouth daily.     MYRBETRIQ 25 MG Tb24 tablet  Generic drug:  mirabegron ER  Take 1 tablet by mouth daily.        Signed: Hvozdovic, Vita Barley PA-C 10/11/2015, 1:37 PM

## 2015-10-12 ENCOUNTER — Encounter (HOSPITAL_COMMUNITY): Payer: Self-pay | Admitting: Internal Medicine

## 2015-10-16 ENCOUNTER — Encounter: Payer: Self-pay | Admitting: Internal Medicine

## 2015-10-16 DIAGNOSIS — Z86010 Personal history of colonic polyps: Secondary | ICD-10-CM

## 2015-10-16 NOTE — Progress Notes (Signed)
Quick Note:  6 adenomas - repeat colonoscopy 2019 ______

## 2015-10-31 NOTE — Progress Notes (Signed)
Quick Note:  He was supposed to do a f/u CBC after his post-polypectomy bleed Please check on that - perhaps he did at [facility] on-site? OK to do it there and have it faxed if he wants ______

## 2016-02-22 ENCOUNTER — Encounter: Payer: Self-pay | Admitting: Internal Medicine

## 2016-12-31 DIAGNOSIS — R911 Solitary pulmonary nodule: Secondary | ICD-10-CM

## 2016-12-31 HISTORY — DX: Solitary pulmonary nodule: R91.1

## 2017-01-18 ENCOUNTER — Other Ambulatory Visit: Payer: Self-pay | Admitting: Unknown Physician Specialty

## 2017-01-18 DIAGNOSIS — M545 Low back pain: Secondary | ICD-10-CM

## 2017-01-26 ENCOUNTER — Inpatient Hospital Stay
Admission: RE | Admit: 2017-01-26 | Discharge: 2017-01-26 | Disposition: A | Discharge status: Home / Self Care | Payer: BLUE CROSS/BLUE SHIELD | Source: Ambulatory Visit | Attending: Unknown Physician Specialty | Admitting: Unknown Physician Specialty

## 2017-07-24 ENCOUNTER — Other Ambulatory Visit: Payer: Self-pay | Admitting: Student

## 2017-07-26 ENCOUNTER — Other Ambulatory Visit: Payer: Self-pay | Admitting: Student

## 2017-07-26 DIAGNOSIS — M5416 Radiculopathy, lumbar region: Secondary | ICD-10-CM

## 2017-07-28 ENCOUNTER — Other Ambulatory Visit: Payer: BLUE CROSS/BLUE SHIELD

## 2017-08-12 ENCOUNTER — Other Ambulatory Visit: Payer: BLUE CROSS/BLUE SHIELD

## 2017-11-11 ENCOUNTER — Other Ambulatory Visit: Payer: Self-pay | Admitting: Family Medicine

## 2017-11-11 DIAGNOSIS — F17201 Nicotine dependence, unspecified, in remission: Secondary | ICD-10-CM

## 2017-11-13 ENCOUNTER — Ambulatory Visit
Admission: RE | Admit: 2017-11-13 | Discharge: 2017-11-13 | Disposition: A | Discharge status: Home / Self Care | Payer: BLUE CROSS/BLUE SHIELD | Source: Ambulatory Visit | Attending: Family Medicine | Admitting: Family Medicine

## 2017-11-13 DIAGNOSIS — F17201 Nicotine dependence, unspecified, in remission: Secondary | ICD-10-CM

## 2017-11-20 ENCOUNTER — Other Ambulatory Visit (HOSPITAL_COMMUNITY): Payer: Self-pay | Admitting: Family Medicine

## 2017-11-20 DIAGNOSIS — R918 Other nonspecific abnormal finding of lung field: Secondary | ICD-10-CM

## 2017-12-04 ENCOUNTER — Encounter (HOSPITAL_COMMUNITY)
Admission: RE | Admit: 2017-12-04 | Discharge: 2017-12-04 | Disposition: A | Discharge status: Home / Self Care | Payer: BLUE CROSS/BLUE SHIELD | Source: Ambulatory Visit | Attending: Family Medicine | Admitting: Family Medicine

## 2017-12-04 DIAGNOSIS — R918 Other nonspecific abnormal finding of lung field: Secondary | ICD-10-CM | POA: Insufficient documentation

## 2017-12-04 LAB — GLUCOSE, CAPILLARY: Glucose-Capillary: 104 mg/dL — ABNORMAL HIGH (ref 65–99)

## 2017-12-04 MED ORDER — FLUDEOXYGLUCOSE F - 18 (FDG) INJECTION
9.9500 | Freq: Once | INTRAVENOUS | Status: AC | PRN
  Administered 2017-12-04: 9.95 via INTRAVENOUS

## 2017-12-30 NOTE — Addendum Note (Signed)
Encounter addended by: Greer Pickerel on: 12/30/2017 4:03 PM  Actions taken: Imaging Exam ended

## 2018-04-24 ENCOUNTER — Other Ambulatory Visit: Payer: Self-pay | Admitting: Family Medicine

## 2018-04-24 DIAGNOSIS — I77810 Thoracic aortic ectasia: Secondary | ICD-10-CM

## 2018-04-28 ENCOUNTER — Inpatient Hospital Stay
Admission: RE | Admit: 2018-04-28 | Discharge: 2018-04-28 | Disposition: A | Discharge status: Home / Self Care | Payer: BLUE CROSS/BLUE SHIELD | Source: Ambulatory Visit | Attending: Family Medicine | Admitting: Family Medicine

## 2018-04-29 ENCOUNTER — Ambulatory Visit
Admission: RE | Admit: 2018-04-29 | Discharge: 2018-04-29 | Disposition: A | Discharge status: Home / Self Care | Payer: BLUE CROSS/BLUE SHIELD | Source: Ambulatory Visit | Attending: Family Medicine | Admitting: Family Medicine

## 2018-04-29 DIAGNOSIS — I77810 Thoracic aortic ectasia: Secondary | ICD-10-CM

## 2018-04-29 MED ORDER — IOPAMIDOL (ISOVUE-370) INJECTION 76%
75.0000 mL | Freq: Once | INTRAVENOUS | Status: AC | PRN
  Administered 2018-04-29: 75 mL via INTRAVENOUS

## 2018-09-09 ENCOUNTER — Encounter: Payer: Self-pay | Admitting: Internal Medicine

## 2018-10-03 ENCOUNTER — Other Ambulatory Visit: Payer: Self-pay | Admitting: Neurosurgery

## 2018-10-03 DIAGNOSIS — M5440 Lumbago with sciatica, unspecified side: Secondary | ICD-10-CM

## 2018-10-07 ENCOUNTER — Other Ambulatory Visit: Payer: Self-pay

## 2018-10-07 ENCOUNTER — Ambulatory Visit (AMBULATORY_SURGERY_CENTER): Payer: Self-pay | Admitting: *Deleted

## 2018-10-07 ENCOUNTER — Encounter: Payer: Self-pay | Admitting: Internal Medicine

## 2018-10-07 VITALS — Ht 70.0 in | Wt 218.2 lb

## 2018-10-07 DIAGNOSIS — Z86010 Personal history of colonic polyps: Secondary | ICD-10-CM

## 2018-10-07 NOTE — Progress Notes (Signed)
No egg or soy allergy known to patient  No issues with past sedation with any surgeries  or procedures, no intubation problems  No diet pills per patient No home 02 use per patient  No blood thinners per patient  Pt denies issues with constipation  No A fib or A flutter  EMMI video offered and declined by the patient.

## 2018-10-15 ENCOUNTER — Ambulatory Visit
Admission: RE | Admit: 2018-10-15 | Discharge: 2018-10-15 | Disposition: A | Discharge status: Home / Self Care | Payer: BLUE CROSS/BLUE SHIELD | Source: Ambulatory Visit | Attending: Neurosurgery | Admitting: Neurosurgery

## 2018-10-15 DIAGNOSIS — M5440 Lumbago with sciatica, unspecified side: Secondary | ICD-10-CM

## 2018-10-15 MED ORDER — GADOBENATE DIMEGLUMINE 529 MG/ML IV SOLN
20.0000 mL | Freq: Once | INTRAVENOUS | Status: AC | PRN
  Administered 2018-10-15: 20 mL via INTRAVENOUS

## 2018-10-22 ENCOUNTER — Encounter: Payer: Self-pay | Admitting: Internal Medicine

## 2018-10-22 ENCOUNTER — Ambulatory Visit (AMBULATORY_SURGERY_CENTER): Payer: BLUE CROSS/BLUE SHIELD | Admitting: Internal Medicine

## 2018-10-22 VITALS — BP 111/75 | HR 74 | Temp 99.3°F | Resp 19 | Ht 70.0 in | Wt 218.0 lb

## 2018-10-22 DIAGNOSIS — Z86010 Personal history of colonic polyps: Secondary | ICD-10-CM

## 2018-10-22 DIAGNOSIS — D124 Benign neoplasm of descending colon: Secondary | ICD-10-CM | POA: Diagnosis not present

## 2018-10-22 MED ORDER — SODIUM CHLORIDE 0.9 % IV SOLN: 500.0000 mL | Freq: Once | INTRAVENOUS | Status: DC

## 2018-10-22 NOTE — Progress Notes (Signed)
Called to room to assist during endoscopic procedure.  Patient ID and intended procedure confirmed with present staff. Received instructions for my participation in the procedure from the performing physician.  

## 2018-10-22 NOTE — Patient Instructions (Addendum)
4 tiny polyps removed today - I will let you know pathology results and when to have another routine colonoscopy by mail and/or My Chart.  I hope you get good news and an easy fix for your back.  I appreciate the opportunity to care for you. Gatha Mayer, MD, [facility]  Handouts given on polyps. YOU HAD AN ENDOSCOPIC PROCEDURE TODAY AT [facility]:   Refer to the procedure report that was given to you for any specific questions about what was found during the examination.  If the procedure report does not answer your questions, please call your gastroenterologist to clarify.  If you requested that your care partner not be given the details of your procedure findings, then the procedure report has been included in a sealed envelope for you to review at your convenience later.  YOU SHOULD EXPECT: Some feelings of bloating in the abdomen. Passage of more gas than usual.  Walking can help get rid of the air that was put into your GI tract during the procedure and reduce the bloating. If you had a lower endoscopy (such as a colonoscopy or flexible sigmoidoscopy) you may notice spotting of blood in your stool or on the toilet paper. If you underwent a bowel prep for your procedure, you may not have a normal bowel movement for a few days.  Please Note:  You might notice some irritation and congestion in your nose or some drainage.  This is from the oxygen used during your procedure.  There is no need for concern and it should clear up in a day or so.  SYMPTOMS TO REPORT IMMEDIATELY:   Following lower endoscopy (colonoscopy or flexible sigmoidoscopy):  Excessive amounts of blood in the stool  Significant tenderness or worsening of abdominal pains  Swelling of the abdomen that is new, acute  Fever of 100F or higher   For urgent or emergent issues, a gastroenterologist can be reached at any hour by calling [phone].   DIET:  We do recommend a small meal at first, but  then you may proceed to your regular diet.  Drink plenty of fluids but you should avoid alcoholic beverages for 24 hours.  ACTIVITY:  You should plan to take it easy for the rest of today and you should NOT DRIVE or use heavy machinery until tomorrow (because of the sedation medicines used during the test).    FOLLOW UP: Our staff will call the number listed on your records the next business day following your procedure to check on you and address any questions or concerns that you may have regarding the information given to you following your procedure. If we do not reach you, we will leave a message.  However, if you are feeling well and you are not experiencing any problems, there is no need to return our call.  We will assume that you have returned to your regular daily activities without incident.  If any biopsies were taken you will be contacted by phone or by letter within the next 1-3 weeks.  Please call us at [phone] if you have not heard about the biopsies in 3 weeks.    SIGNATURES/CONFIDENTIALITY: You and/or your care partner have signed paperwork which will be entered into your electronic medical record.  These signatures attest to the fact that that the information above on your After Visit Summary has been reviewed and is understood.  Full responsibility of the confidentiality of this discharge information lies with you  and/or your care-partner. 

## 2018-10-22 NOTE — Progress Notes (Signed)
PT taken to PACU. Monitors in place. VSS. Report given to RN. 

## 2018-10-22 NOTE — Op Note (Signed)
[facility] Patient Name: Jesse Perez Procedure Date: 10/22/2018 3:35 PM MRN: [id number] Endoscopist: Gatha Mayer , MD Age: 67 Referring MD:  Date of Birth: 08-29-1951 Gender: Male Account #: [id number] Procedure:                Colonoscopy Indications:              Surveillance: Personal history of adenomatous                            polyps on last colonoscopy 3 years ago Medicines:                Propofol per Anesthesia, Monitored Anesthesia Care Procedure:                Pre-Anesthesia Assessment:                           - Prior to the procedure, a History and Physical                            was performed, and patient medications and                            allergies were reviewed. The patient's tolerance of                            previous anesthesia was also reviewed. The risks                            and benefits of the procedure and the sedation                            options and risks were discussed with the patient.                            All questions were answered, and informed consent                            was obtained. Prior Anticoagulants: The patient has                            taken no previous anticoagulant or antiplatelet                            agents. ASA Grade Assessment: II - A patient with                            mild systemic disease. After reviewing the risks                            and benefits, the patient was deemed in                            satisfactory condition to undergo the procedure.  After obtaining informed consent, the colonoscope                            was passed under direct vision. Throughout the                            procedure, the patient's blood pressure, pulse, and                            oxygen saturations were monitored continuously. The                            Colonoscope was introduced through the anus and   advanced to the the cecum, identified by                            appendiceal orifice and ileocecal valve. The                            colonoscopy was technically difficult and complex                            due to a redundant colon and significant looping.                            Successful completion of the procedure was aided by                            withdrawing and reinserting the scope and applying                            abdominal pressure. The patient tolerated the                            procedure well. The quality of the bowel                            preparation was good. The ileocecal valve,                            appendiceal orifice, and rectum were photographed. Scope In: 3:47:53 PM Scope Out: 4:12:58 PM Scope Withdrawal Time: 0 hours 14 minutes 19 seconds  Total Procedure Duration: 0 hours 25 minutes 5 seconds  Findings:                 The perianal examination was normal.                           The digital rectal exam findings include surgically                            absent prostate.                           Four sessile polyps were found in the descending  colon. The polyps were diminutive in size. These                            polyps were removed with a cold snare. Resection                            and retrieval were complete. Verification of                            patient identification for the specimen was done.                            Estimated blood loss was minimal.                           External hemorrhoids were found.                           Anal papilla(e) were hypertrophied.                           The exam was otherwise without abnormality on                            direct and retroflexion views. Complications:            No immediate complications. Estimated Blood Loss:     Estimated blood loss was minimal. Impression:               - A surgically absent prostate found on  digital                            rectal exam.                           - Four diminutive polyps in the descending colon,                            removed with a cold snare. Resected and retrieved.                           - External hemorrhoids.                           - Anal papilla(e) were hypertrophied.                           - The examination was otherwise normal on direct                            and retroflexion views.                           - Personal history of colonic polyps - adenomas  since 2006. 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 The signs and symptoms of potential                            delayed complications were discussed with the                            patient. Return to normal activities tomorrow.                            Written discharge instructions were provided to the                            patient.                           - Resume previous diet.                           - Continue present medications.                           - Repeat colonoscopy is recommended for                            surveillance. The colonoscopy date will be                            determined after pathology results from today's                            exam become available for review. Gatha Mayer, MD 10/22/2018 4:22:24 PM This report has been signed electronically.

## 2018-10-22 NOTE — Progress Notes (Signed)
Pt's states no medical or surgical changes since previsit or office visit. 

## 2018-10-23 ENCOUNTER — Telehealth: Payer: Self-pay | Admitting: *Deleted

## 2018-10-23 NOTE — Telephone Encounter (Signed)
  Follow up Call-  Call back number 10/22/2018  Post procedure Call Back phone  # [phone]  Permission to leave phone message Yes  Some recent data might be hidden     Patient questions:  Do you have a fever, pain , or abdominal swelling? No. Pain Score  0 *  Have you tolerated food without any problems? Yes.    Have you been able to return to your normal activities? Yes.    Do you have any questions about your discharge instructions: Diet   No. Medications  No. Follow up visit  No.  Do you have questions or concerns about your Care? No.  Actions: * If pain score is 4 or above: No action needed, pain <4.

## 2018-11-02 ENCOUNTER — Encounter: Payer: Self-pay | Admitting: Internal Medicine

## 2018-11-02 NOTE — Progress Notes (Signed)
4 adenomas Recall 2022 My Chart

## 2019-06-15 ENCOUNTER — Other Ambulatory Visit: Payer: Self-pay | Admitting: Family Medicine

## 2019-06-15 DIAGNOSIS — I712 Thoracic aortic aneurysm, without rupture, unspecified: Secondary | ICD-10-CM

## 2019-06-15 DIAGNOSIS — I10 Essential (primary) hypertension: Secondary | ICD-10-CM | POA: Diagnosis not present

## 2019-06-15 DIAGNOSIS — Z23 Encounter for immunization: Secondary | ICD-10-CM | POA: Diagnosis not present

## 2019-06-15 DIAGNOSIS — Z8546 Personal history of malignant neoplasm of prostate: Secondary | ICD-10-CM | POA: Diagnosis not present

## 2019-06-15 DIAGNOSIS — Z85820 Personal history of malignant melanoma of skin: Secondary | ICD-10-CM | POA: Diagnosis not present

## 2019-06-15 DIAGNOSIS — K219 Gastro-esophageal reflux disease without esophagitis: Secondary | ICD-10-CM | POA: Diagnosis not present

## 2019-06-15 DIAGNOSIS — R69 Illness, unspecified: Secondary | ICD-10-CM | POA: Diagnosis not present

## 2019-06-15 DIAGNOSIS — E785 Hyperlipidemia, unspecified: Secondary | ICD-10-CM | POA: Diagnosis not present

## 2019-06-15 DIAGNOSIS — M519 Unspecified thoracic, thoracolumbar and lumbosacral intervertebral disc disorder: Secondary | ICD-10-CM | POA: Diagnosis not present

## 2019-06-15 DIAGNOSIS — E1169 Type 2 diabetes mellitus with other specified complication: Secondary | ICD-10-CM | POA: Diagnosis not present

## 2019-06-26 ENCOUNTER — Ambulatory Visit
Admission: RE | Admit: 2019-06-26 | Discharge: 2019-06-26 | Disposition: A | Discharge status: Home / Self Care | Payer: Medicare HMO | Source: Ambulatory Visit | Attending: Family Medicine | Admitting: Family Medicine

## 2019-06-26 DIAGNOSIS — I712 Thoracic aortic aneurysm, without rupture, unspecified: Secondary | ICD-10-CM

## 2019-06-26 MED ORDER — IOPAMIDOL (ISOVUE-370) INJECTION 76%
75.0000 mL | Freq: Once | INTRAVENOUS | Status: AC | PRN
  Administered 2019-06-26: 75 mL via INTRAVENOUS

## 2019-06-29 DIAGNOSIS — J329 Chronic sinusitis, unspecified: Secondary | ICD-10-CM | POA: Diagnosis not present

## 2019-06-29 DIAGNOSIS — K219 Gastro-esophageal reflux disease without esophagitis: Secondary | ICD-10-CM | POA: Diagnosis not present

## 2019-09-23 DIAGNOSIS — H8120 Vestibular neuronitis, unspecified ear: Secondary | ICD-10-CM | POA: Diagnosis not present

## 2019-09-23 DIAGNOSIS — H903 Sensorineural hearing loss, bilateral: Secondary | ICD-10-CM | POA: Diagnosis not present

## 2019-09-23 DIAGNOSIS — R42 Dizziness and giddiness: Secondary | ICD-10-CM | POA: Diagnosis not present

## 2019-09-23 DIAGNOSIS — H9113 Presbycusis, bilateral: Secondary | ICD-10-CM | POA: Diagnosis not present

## 2019-09-23 DIAGNOSIS — I4891 Unspecified atrial fibrillation: Secondary | ICD-10-CM | POA: Diagnosis not present

## 2019-09-24 ENCOUNTER — Encounter: Payer: Self-pay | Admitting: Internal Medicine

## 2019-09-24 ENCOUNTER — Ambulatory Visit: Payer: Medicare HMO | Admitting: Internal Medicine

## 2019-09-24 ENCOUNTER — Other Ambulatory Visit: Payer: Self-pay

## 2019-09-24 DIAGNOSIS — R0602 Shortness of breath: Secondary | ICD-10-CM

## 2019-09-24 DIAGNOSIS — I493 Ventricular premature depolarization: Secondary | ICD-10-CM

## 2019-09-24 MED ORDER — METOPROLOL SUCCINATE ER 50 MG PO TB24: ORAL_TABLET | ORAL | 3 refills | Status: DC

## 2019-09-24 NOTE — Patient Instructions (Signed)
Medication Instructions:  Your physician has recommended you make the following change in your medication:   1.  Start taking Toprol XL 50 mg---Take 1/2 tablet by mouth daily x 2 weeks then increase to a whole tablet by mouth daily.   Labwork: None ordered.  Testing/Procedures: Come to the [facility] in 3-4 weeks for a repeat EKG.   Follow-Up: Your physician wants you to follow-up in: 6-8 weeks with Dr. Lovena Le.       Any Other Special Instructions Will Be Listed Below (If Applicable).  If you need a refill on your cardiac medications before your next appointment, please call your pharmacy.

## 2019-09-24 NOTE — Progress Notes (Signed)
HPI Jesse Perez is referred today by Dr. Erik Obey for evaluation of vertigo and an irregular heart beat. The patient saw Dr. Gwenlyn Found 5 years ago for an episode of increased bp. He was thought to have sleep apnea and underwent sleep study which confirmed sleep apnea but he could not tolerate CPAP. He has lost weight and his snoring has improved. The patient noted the sensation of the room spinning when he awoke on Saturday and this has persisted to some extent. He was found to have an irregular heart beat and present for additional eval. He has not had syncope and denies chest pain or sob. No edema.  Allergies  Allergen Reactions  . Penicillins Anaphylaxis    Has patient had a PCN reaction causing immediate rash, facial/tongue/throat swelling, SOB or lightheadedness with hypotension: Yes Has patient had a PCN reaction causing severe rash involving mucus membranes or skin necrosis: No Has patient had a PCN reaction that required hospitalization Yes Has patient had a PCN reaction occurring within the last 10 years: No If all of the above answers are "NO", then may proceed with Cephalosporin use.    Marland Kitchen Zolpidem Tartrate Anaphylaxis     Current Outpatient Medications  Medication Sig Dispense Refill  . loratadine (CLARITIN) 10 MG tablet Take 10 mg by mouth daily as needed for allergies.     Marland Kitchen losartan (COZAAR) 50 MG tablet Take 1 tablet (50 mg total) by mouth daily. 30 tablet 0  . omeprazole (PRILOSEC) 40 MG capsule Take 1 capsule by mouth 2 (two) times daily.    . rosuvastatin (CRESTOR) 5 MG tablet Take 5 mg by mouth once a week.    . traZODone (DESYREL) 50 MG tablet Take 1 tablet by mouth as needed for sleep.     No current facility-administered medications for this visit.      Past Medical History:  Diagnosis Date  . Allergy   . Bradycardia   . Diabetes mellitus    no meds  . Family history of colon cancer - grandfather and CHEK mutation increasing colon cancer risk 10/03/2015   . GERD (gastroesophageal reflux disease)   . Hemochromatosis carrier   . Hyperlipidemia   . Hypertension   . Melanoma ([place])   . Obstructive sleep apnea    does not use CPAP  . Prostate cancer ([place]) 2011   Gleason score=3  . Shingles     ROS:   All systems reviewed and negative except as noted in the HPI.   Past Surgical History:  Procedure Laterality Date  . APPENDECTOMY    . BACK SURGERY    . COLONOSCOPY    . COLONOSCOPY WITH PROPOFOL N/A 10/11/2015   Procedure: COLONOSCOPY WITH PROPOFOL;  Surgeon: Irene Shipper, MD;  Location: [facility];  Service: Endoscopy;  Laterality: N/A;  . excision of melanoma    . PROSTATE SURGERY    . US ECHOCARDIOGRAPHY  09/05/2011   borderline aortic root dilatation     Family History  Problem Relation Age of Onset  . Breast cancer Mother 57  . Cancer Mother   . Hypertension Father   . Stroke Father   . Breast cancer Sister 4       BRCA neg  . Colon polyps Sister   . Breast cancer Maternal Grandmother        dx in her 8s  . Breast cancer Daughter 75       ATM and CHEK2 carrier  . Hemochromatosis Daughter 16  C282Y and S65C double heterozygote  . Colon cancer Paternal Grandfather   . Crohn's disease Neg Hx   . Liver cancer Neg Hx   . Pancreatic cancer Neg Hx   . Rectal cancer Neg Hx   . Stomach cancer Neg Hx   . Ulcerative colitis Neg Hx   . Esophageal cancer Neg Hx      Social History   Socioeconomic History  . Marital status: Married    Spouse name: Not on file  . Number of children: 3  . Years of education: Not on file  . Highest education level: Not on file  Occupational History    Employer: [employer]  . Financial resource strain: Not on file  . Food insecurity    Worry: Not on file    Inability: Not on file  . Transportation needs    Medical: Not on file    Non-medical: Not on file  Tobacco Use  . Smoking status: Former Smoker    Quit date: 12/31/1996    Years since quitting: 22.7  .  Smokeless tobacco: Never Used  Substance and Sexual Activity  . Alcohol use: Yes    Alcohol/week: 0.0 standard drinks    Comment: weekends only  . Drug use: No  . Sexual activity: Not on file  Lifestyle  . Physical activity    Days per week: Not on file    Minutes per session: Not on file  . Stress: Not on file  Relationships  . Social Herbalist on phone: Not on file    Gets together: Not on file    Attends religious service: Not on file    Active member of club or organization: Not on file    Attends meetings of clubs or organizations: Not on file    Relationship status: Not on file  . Intimate partner violence    Fear of current or ex partner: Not on file    Emotionally abused: Not on file    Physically abused: Not on file    Forced sexual activity: Not on file  Other Topics Concern  . Not on file  Social History Narrative  . Not on file     BP 136/64   Pulse 69   Ht 5' 10" (1.778 m)   Wt 216 lb (98 kg)   SpO2 98%   BMI 30.99 kg/m   Physical Exam:  Well appearing NAD HEENT: Unremarkable Neck:  No JVD, no thyromegally Lymphatics:  No adenopathy Back:  No CVA tenderness Lungs:  Clear with no wheezes HEART:  IRegular rate rhythm, no murmurs, no rubs, no clicks Abd:  soft, positive bowel sounds, no organomegally, no rebound, no guarding Ext:  2 plus pulses, no edema, no cyanosis, no clubbing Skin:  No rashes no nodules Neuro:  CN II through XII intact, motor grossly intact  EKG - nsr with PVC's in a bigeminal fashion   Assess/Plan: 1. Symptomatic PVC's - I have discussed the treatment options. We will start a beta blocker and titrate up. We also discussed other meds and catheter ablation. I plan to have him return in a few weeks for an ECG and then a 24 hour monitor. Additional rec's will depend on how he responds. He has been asked to limit ETOH and caffeine to no more than one drink a day. 2. HTN - his bp is up a little today. His bp should  tolerate the beta blocker. 3. Dyslipidemia - he will  continue his statin.   Gregg Taylor,M.D. 

## 2019-10-15 ENCOUNTER — Other Ambulatory Visit: Payer: Self-pay

## 2019-10-15 ENCOUNTER — Ambulatory Visit (INDEPENDENT_AMBULATORY_CARE_PROVIDER_SITE_OTHER): Payer: Medicare HMO | Admitting: *Deleted

## 2019-10-15 VITALS — BP 126/68 | HR 62 | Ht 70.0 in | Wt 216.0 lb

## 2019-10-15 DIAGNOSIS — I493 Ventricular premature depolarization: Secondary | ICD-10-CM

## 2019-10-15 NOTE — Progress Notes (Signed)
1.) Reason for visit: EKG in 3-4 weeks per Dr. Lovena Le   2.) Name of MD requesting visit: Dr. Lovena Le  3. H&P: Assess/Plan: Symptomatic PVC's  We will start a beta blocker and titrate up.    4.)ROS related to problem: Pt here for 3-4 week EKG follow-up per Dr. Lovena Le, for symptomatic PVCs and started the pt on beta blocker for this, and planned to titrate up, if still having PVCs and symptoms.  Pt was started on Toprol XL 50 mg po daily.  EKG was done today and showed to DOD Dr. Marlou Porch.  EKG showed sinus rhythm with possible PAC's, otherwise normal EKG.  HR on EKG was 62 bpm. Pt is asymptomatic today and feels better on Toprol XL 50 mg po daily, and has experienced no side effects from this medication. Per Dr. Marlou Porch DOD, pt should continue his current medication regimen and beta blocker, and follow-up with Dr. Lovena Le, as scheduled for 01/12/2020 at 1030.   5.) Assessment and plan per MD: per DOD Dr. Marlou Porch, pt is to continue current medication regimen, and follow-up with Dr. Lovena Le as planned.

## 2019-10-15 NOTE — Patient Instructions (Signed)
Medication Instructions:   Your physician recommends that you continue on your current medications as directed. Please refer to the Current Medication list given to you today.  *If you need a refill on your cardiac medications before your next appointment, please call your pharmacy*    Follow-Up: At [facility], you and your health needs are our priority.  As part of our continuing mission to provide you with exceptional heart care, we have created designated Provider Care Teams.  These Care Teams include your primary Cardiologist (physician) and Advanced Practice Providers (APPs -  Physician Assistants and Nurse Practitioners) who all work together to provide you with the care you need, when you need it.  Your next appointment:   01/12/2020 at 10:30 am with Dr. Lovena Le  The format for your next appointment:   In Person

## 2019-10-26 DIAGNOSIS — D225 Melanocytic nevi of trunk: Secondary | ICD-10-CM | POA: Diagnosis not present

## 2019-10-26 DIAGNOSIS — Z85828 Personal history of other malignant neoplasm of skin: Secondary | ICD-10-CM | POA: Diagnosis not present

## 2019-10-26 DIAGNOSIS — D485 Neoplasm of uncertain behavior of skin: Secondary | ICD-10-CM | POA: Diagnosis not present

## 2019-10-26 DIAGNOSIS — D2262 Melanocytic nevi of left upper limb, including shoulder: Secondary | ICD-10-CM | POA: Diagnosis not present

## 2019-10-26 DIAGNOSIS — Z85820 Personal history of malignant melanoma of skin: Secondary | ICD-10-CM | POA: Diagnosis not present

## 2019-10-26 DIAGNOSIS — C44519 Basal cell carcinoma of skin of other part of trunk: Secondary | ICD-10-CM | POA: Diagnosis not present

## 2019-10-26 DIAGNOSIS — D3610 Benign neoplasm of peripheral nerves and autonomic nervous system, unspecified: Secondary | ICD-10-CM | POA: Diagnosis not present

## 2019-11-10 ENCOUNTER — Ambulatory Visit: Payer: Medicare HMO | Admitting: Internal Medicine

## 2019-11-10 DIAGNOSIS — Z85820 Personal history of malignant melanoma of skin: Secondary | ICD-10-CM | POA: Diagnosis not present

## 2019-11-10 DIAGNOSIS — D3617 Benign neoplasm of peripheral nerves and autonomic nervous system of trunk, unspecified: Secondary | ICD-10-CM | POA: Diagnosis not present

## 2019-11-10 DIAGNOSIS — Z85828 Personal history of other malignant neoplasm of skin: Secondary | ICD-10-CM | POA: Diagnosis not present

## 2019-11-10 DIAGNOSIS — D485 Neoplasm of uncertain behavior of skin: Secondary | ICD-10-CM | POA: Diagnosis not present

## 2019-11-18 DIAGNOSIS — I10 Essential (primary) hypertension: Secondary | ICD-10-CM | POA: Diagnosis not present

## 2019-11-18 DIAGNOSIS — Z85820 Personal history of malignant melanoma of skin: Secondary | ICD-10-CM | POA: Diagnosis not present

## 2019-11-18 DIAGNOSIS — M519 Unspecified thoracic, thoracolumbar and lumbosacral intervertebral disc disorder: Secondary | ICD-10-CM | POA: Diagnosis not present

## 2019-11-18 DIAGNOSIS — E785 Hyperlipidemia, unspecified: Secondary | ICD-10-CM | POA: Diagnosis not present

## 2019-11-18 DIAGNOSIS — Z23 Encounter for immunization: Secondary | ICD-10-CM | POA: Diagnosis not present

## 2019-11-18 DIAGNOSIS — E1169 Type 2 diabetes mellitus with other specified complication: Secondary | ICD-10-CM | POA: Diagnosis not present

## 2019-11-18 DIAGNOSIS — Z0000 Encounter for general adult medical examination without abnormal findings: Secondary | ICD-10-CM | POA: Diagnosis not present

## 2019-11-18 DIAGNOSIS — R69 Illness, unspecified: Secondary | ICD-10-CM | POA: Diagnosis not present

## 2019-11-18 DIAGNOSIS — K2100 Gastro-esophageal reflux disease with esophagitis, without bleeding: Secondary | ICD-10-CM | POA: Diagnosis not present

## 2019-11-18 DIAGNOSIS — Z8546 Personal history of malignant neoplasm of prostate: Secondary | ICD-10-CM | POA: Diagnosis not present

## 2020-01-12 ENCOUNTER — Encounter: Payer: Self-pay | Admitting: Internal Medicine

## 2020-01-12 ENCOUNTER — Ambulatory Visit: Payer: Medicare HMO | Admitting: Internal Medicine

## 2020-01-12 ENCOUNTER — Other Ambulatory Visit: Payer: Self-pay

## 2020-01-12 VITALS — BP 144/80 | HR 52 | Ht 69.0 in | Wt 218.4 lb

## 2020-01-12 DIAGNOSIS — I493 Ventricular premature depolarization: Secondary | ICD-10-CM

## 2020-01-12 DIAGNOSIS — I10 Essential (primary) hypertension: Secondary | ICD-10-CM | POA: Diagnosis not present

## 2020-01-12 NOTE — Patient Instructions (Signed)

## 2020-01-12 NOTE — Progress Notes (Signed)
HPI [name] returns today for followup. He is a pleasant 69 yo man with HTN who I saw about 4 months ago with palpitations and PVC's and started on toprol. He has done fairly well although 2 nights ago he noted some dizzy spells when he turned over in bed. Not clear that his heart was beating abnormally. He denies chest pain, sob or peripheral edema.  Allergies  Allergen Reactions  . Penicillins Anaphylaxis    Has patient had a PCN reaction causing immediate rash, facial/tongue/throat swelling, SOB or lightheadedness with hypotension: Yes Has patient had a PCN reaction causing severe rash involving mucus membranes or skin necrosis: No Has patient had a PCN reaction that required hospitalization Yes Has patient had a PCN reaction occurring within the last 10 years: No If all of the above answers are "NO", then may proceed with Cephalosporin use.    Marland Kitchen Zolpidem Tartrate Anaphylaxis     Current Outpatient Medications  Medication Sig Dispense Refill  . loratadine (CLARITIN) 10 MG tablet Take 10 mg by mouth daily as needed for allergies.     Marland Kitchen losartan (COZAAR) 50 MG tablet Take 1 tablet (50 mg total) by mouth daily. 30 tablet 0  . metoprolol succinate (TOPROL-XL) 50 MG 24 hr tablet Take 50 mg by mouth daily. Take with or immediately following a meal.    . omeprazole (PRILOSEC) 40 MG capsule Take 1 capsule by mouth 2 (two) times daily.    . rosuvastatin (CRESTOR) 5 MG tablet Take 5 mg by mouth once a week.    . traZODone (DESYREL) 50 MG tablet Take 1 tablet by mouth as needed for sleep.     No current facility-administered medications for this visit.     Past Medical History:  Diagnosis Date  . Allergy   . Bradycardia   . Diabetes mellitus    no meds  . Family history of colon cancer - grandfather and CHEK mutation increasing colon cancer risk 10/03/2015  . GERD (gastroesophageal reflux disease)   . Hemochromatosis carrier   . Hyperlipidemia   . Hypertension   .  Melanoma ([place])   . Obstructive sleep apnea    does not use CPAP  . Prostate cancer ([place]) 2011   Gleason score=3  . Shingles     ROS:   All systems reviewed and negative except as noted in the HPI.   Past Surgical History:  Procedure Laterality Date  . APPENDECTOMY    . BACK SURGERY    . COLONOSCOPY    . COLONOSCOPY WITH PROPOFOL N/A 10/11/2015   Procedure: COLONOSCOPY WITH PROPOFOL;  Surgeon: Jesse Shipper, MD;  Location: [facility];  Service: Endoscopy;  Laterality: N/A;  . excision of melanoma    . PROSTATE SURGERY    . US ECHOCARDIOGRAPHY  09/05/2011   borderline aortic root dilatation     Family History  Problem Relation Age of Onset  . Breast cancer Mother 15  . Cancer Mother   . Hypertension Father   . Stroke Father   . Breast cancer Sister 46       BRCA neg  . Colon polyps Sister   . Breast cancer Maternal Grandmother        dx in her 15s  . Breast cancer Daughter 15       ATM and CHEK2 carrier  . Hemochromatosis Daughter 18       C282Y and S65C double heterozygote  . Colon cancer Paternal Grandfather   .  Crohn's disease Neg Hx   . Liver cancer Neg Hx   . Pancreatic cancer Neg Hx   . Rectal cancer Neg Hx   . Stomach cancer Neg Hx   . Ulcerative colitis Neg Hx   . Esophageal cancer Neg Hx      Social History   Socioeconomic History  . Marital status: Married    Spouse name: Not on file  . Number of children: 3  . Years of education: Not on file  . Highest education level: Not on file  Occupational History    Employer: [employer]  Tobacco Use  . Smoking status: Former Smoker    Quit date: 12/31/1996    Years since quitting: 23.0  . Smokeless tobacco: Never Used  Substance and Sexual Activity  . Alcohol use: Yes    Alcohol/week: 0.0 standard drinks    Comment: weekends only  . Drug use: No  . Sexual activity: Not on file  Other Topics Concern  . Not on file  Social History Narrative  . Not on file   Social Determinants of Health    Financial Resource Strain:   . Difficulty of Paying Living Expenses: Not on file  Food Insecurity:   . Worried About Charity fundraiser in the Last Year: Not on file  . Ran Out of Food in the Last Year: Not on file  Transportation Needs:   . Lack of Transportation (Medical): Not on file  . Lack of Transportation (Non-Medical): Not on file  Physical Activity:   . Days of Exercise per Week: Not on file  . Minutes of Exercise per Session: Not on file  Stress:   . Feeling of Stress : Not on file  Social Connections:   . Frequency of Communication with Friends and Family: Not on file  . Frequency of Social Gatherings with Friends and Family: Not on file  . Attends Religious Services: Not on file  . Active Member of Clubs or Organizations: Not on file  . Attends Archivist Meetings: Not on file  . Marital Status: Not on file  Intimate Partner Violence:   . Fear of Current or Ex-Partner: Not on file  . Emotionally Abused: Not on file  . Physically Abused: Not on file  . Sexually Abused: Not on file     BP (!) 144/80   Pulse (!) 52   Ht '5\' 9"'  (1.753 m)   Wt 218 lb 6.4 oz (99.1 kg)   SpO2 97%   BMI 32.25 kg/m   Physical Exam:  Well appearing NAD HEENT: Unremarkable Neck:  No JVD, no thyromegally Lymphatics:  No adenopathy Back:  No CVA tenderness Lungs:  Clear with no wheezes HEART:  Regular rate rhythm, no murmurs, no rubs, no clicks Abd:  soft, positive bowel sounds, no organomegally, no rebound, no guarding Ext:  2 plus pulses, no edema, no cyanosis, no clubbing Skin:  No rashes no nodules Neuro:  CN II through XII intact, motor grossly intact  EKG - nsr  Assess/Plan: 1. PVC's - his symptoms appear to be controlled. He has an apple watch and I encouraged him to try and correlate his symptoms. He will continue the beta blocker and try and correlate.  2. HTN - his Sbp is up a bit. He is encouraged to reduce his salt intake and continue his beta blocker and  losartan.  Jesse Perez.D.

## 2020-02-24 ENCOUNTER — Encounter: Payer: Self-pay | Admitting: Genetic Counselor

## 2020-03-10 DIAGNOSIS — R5383 Other fatigue: Secondary | ICD-10-CM | POA: Diagnosis not present

## 2020-03-10 DIAGNOSIS — I10 Essential (primary) hypertension: Secondary | ICD-10-CM | POA: Diagnosis not present

## 2020-03-10 DIAGNOSIS — R42 Dizziness and giddiness: Secondary | ICD-10-CM | POA: Diagnosis not present

## 2020-03-31 DIAGNOSIS — R42 Dizziness and giddiness: Secondary | ICD-10-CM | POA: Diagnosis not present

## 2020-03-31 DIAGNOSIS — R5383 Other fatigue: Secondary | ICD-10-CM | POA: Diagnosis not present

## 2020-03-31 DIAGNOSIS — I10 Essential (primary) hypertension: Secondary | ICD-10-CM | POA: Diagnosis not present

## 2020-05-31 DIAGNOSIS — I10 Essential (primary) hypertension: Secondary | ICD-10-CM | POA: Diagnosis not present

## 2020-05-31 DIAGNOSIS — R1031 Right lower quadrant pain: Secondary | ICD-10-CM | POA: Diagnosis not present

## 2020-05-31 DIAGNOSIS — Z8546 Personal history of malignant neoplasm of prostate: Secondary | ICD-10-CM | POA: Diagnosis not present

## 2020-05-31 DIAGNOSIS — K2100 Gastro-esophageal reflux disease with esophagitis, without bleeding: Secondary | ICD-10-CM | POA: Diagnosis not present

## 2020-05-31 DIAGNOSIS — R69 Illness, unspecified: Secondary | ICD-10-CM | POA: Diagnosis not present

## 2020-05-31 DIAGNOSIS — I712 Thoracic aortic aneurysm, without rupture: Secondary | ICD-10-CM | POA: Diagnosis not present

## 2020-05-31 DIAGNOSIS — E1169 Type 2 diabetes mellitus with other specified complication: Secondary | ICD-10-CM | POA: Diagnosis not present

## 2020-05-31 DIAGNOSIS — E785 Hyperlipidemia, unspecified: Secondary | ICD-10-CM | POA: Diagnosis not present

## 2020-05-31 DIAGNOSIS — Z85820 Personal history of malignant melanoma of skin: Secondary | ICD-10-CM | POA: Diagnosis not present

## 2020-06-03 ENCOUNTER — Other Ambulatory Visit: Payer: Self-pay | Admitting: Family Medicine

## 2020-06-03 DIAGNOSIS — I712 Thoracic aortic aneurysm, without rupture, unspecified: Secondary | ICD-10-CM

## 2020-06-17 ENCOUNTER — Ambulatory Visit
Admission: RE | Admit: 2020-06-17 | Discharge: 2020-06-17 | Disposition: A | Discharge status: Home / Self Care | Payer: Medicare HMO | Source: Ambulatory Visit | Attending: Family Medicine | Admitting: Family Medicine

## 2020-06-17 DIAGNOSIS — I712 Thoracic aortic aneurysm, without rupture, unspecified: Secondary | ICD-10-CM

## 2020-06-17 MED ORDER — IOPAMIDOL (ISOVUE-370) INJECTION 76%
75.0000 mL | Freq: Once | INTRAVENOUS | Status: AC | PRN
  Administered 2020-06-17: 75 mL via INTRAVENOUS

## 2020-06-23 DIAGNOSIS — R109 Unspecified abdominal pain: Secondary | ICD-10-CM | POA: Diagnosis not present

## 2020-06-23 DIAGNOSIS — M7918 Myalgia, other site: Secondary | ICD-10-CM | POA: Diagnosis not present

## 2020-06-29 DIAGNOSIS — R109 Unspecified abdominal pain: Secondary | ICD-10-CM | POA: Diagnosis not present

## 2020-06-29 DIAGNOSIS — M7918 Myalgia, other site: Secondary | ICD-10-CM | POA: Diagnosis not present

## 2020-09-09 DIAGNOSIS — E782 Mixed hyperlipidemia: Secondary | ICD-10-CM | POA: Diagnosis not present

## 2020-09-09 DIAGNOSIS — E1169 Type 2 diabetes mellitus with other specified complication: Secondary | ICD-10-CM | POA: Diagnosis not present

## 2020-10-25 DIAGNOSIS — D225 Melanocytic nevi of trunk: Secondary | ICD-10-CM | POA: Diagnosis not present

## 2020-10-25 DIAGNOSIS — L814 Other melanin hyperpigmentation: Secondary | ICD-10-CM | POA: Diagnosis not present

## 2020-10-25 DIAGNOSIS — Z85820 Personal history of malignant melanoma of skin: Secondary | ICD-10-CM | POA: Diagnosis not present

## 2020-10-25 DIAGNOSIS — L821 Other seborrheic keratosis: Secondary | ICD-10-CM | POA: Diagnosis not present

## 2020-10-25 DIAGNOSIS — D485 Neoplasm of uncertain behavior of skin: Secondary | ICD-10-CM | POA: Diagnosis not present

## 2020-10-25 DIAGNOSIS — Z85828 Personal history of other malignant neoplasm of skin: Secondary | ICD-10-CM | POA: Diagnosis not present

## 2020-10-25 DIAGNOSIS — D2272 Melanocytic nevi of left lower limb, including hip: Secondary | ICD-10-CM | POA: Diagnosis not present

## 2020-10-25 DIAGNOSIS — D2271 Melanocytic nevi of right lower limb, including hip: Secondary | ICD-10-CM | POA: Diagnosis not present

## 2020-11-18 DIAGNOSIS — Z85820 Personal history of malignant melanoma of skin: Secondary | ICD-10-CM | POA: Diagnosis not present

## 2020-11-18 DIAGNOSIS — K2100 Gastro-esophageal reflux disease with esophagitis, without bleeding: Secondary | ICD-10-CM | POA: Diagnosis not present

## 2020-11-18 DIAGNOSIS — Z8546 Personal history of malignant neoplasm of prostate: Secondary | ICD-10-CM | POA: Diagnosis not present

## 2020-11-18 DIAGNOSIS — E1169 Type 2 diabetes mellitus with other specified complication: Secondary | ICD-10-CM | POA: Diagnosis not present

## 2020-11-18 DIAGNOSIS — E785 Hyperlipidemia, unspecified: Secondary | ICD-10-CM | POA: Diagnosis not present

## 2020-11-18 DIAGNOSIS — I10 Essential (primary) hypertension: Secondary | ICD-10-CM | POA: Diagnosis not present

## 2020-11-18 DIAGNOSIS — Z0000 Encounter for general adult medical examination without abnormal findings: Secondary | ICD-10-CM | POA: Diagnosis not present

## 2020-11-18 DIAGNOSIS — I712 Thoracic aortic aneurysm, without rupture: Secondary | ICD-10-CM | POA: Diagnosis not present

## 2020-11-18 DIAGNOSIS — R69 Illness, unspecified: Secondary | ICD-10-CM | POA: Diagnosis not present

## 2020-11-18 DIAGNOSIS — I700 Atherosclerosis of aorta: Secondary | ICD-10-CM | POA: Diagnosis not present

## 2020-12-12 DIAGNOSIS — M542 Cervicalgia: Secondary | ICD-10-CM | POA: Diagnosis not present

## 2020-12-15 DIAGNOSIS — M542 Cervicalgia: Secondary | ICD-10-CM | POA: Diagnosis not present

## 2020-12-20 DIAGNOSIS — M542 Cervicalgia: Secondary | ICD-10-CM | POA: Diagnosis not present

## 2020-12-22 DIAGNOSIS — M542 Cervicalgia: Secondary | ICD-10-CM | POA: Diagnosis not present

## 2020-12-28 DIAGNOSIS — M542 Cervicalgia: Secondary | ICD-10-CM | POA: Diagnosis not present

## 2020-12-29 DIAGNOSIS — M542 Cervicalgia: Secondary | ICD-10-CM | POA: Diagnosis not present

## 2021-01-04 DIAGNOSIS — M542 Cervicalgia: Secondary | ICD-10-CM | POA: Diagnosis not present

## 2021-01-05 DIAGNOSIS — M542 Cervicalgia: Secondary | ICD-10-CM | POA: Diagnosis not present

## 2021-01-09 DIAGNOSIS — M542 Cervicalgia: Secondary | ICD-10-CM | POA: Diagnosis not present

## 2021-01-12 DIAGNOSIS — M542 Cervicalgia: Secondary | ICD-10-CM | POA: Diagnosis not present

## 2021-01-26 DIAGNOSIS — M542 Cervicalgia: Secondary | ICD-10-CM | POA: Diagnosis not present

## 2021-04-07 DIAGNOSIS — M21371 Foot drop, right foot: Secondary | ICD-10-CM | POA: Diagnosis not present

## 2021-05-02 IMAGING — CT CT ANGIOGRAPHY CHEST
2 of 6 series · 13 of 36 positions shown · IV contrast (iopamidol)
Comparison: 04/29/2018; 11/13/2017; PET-CT-12/04/2017

CLINICAL DATA: Follow-up thoracic aortic aneurysm.

EXAM:
CT ANGIOGRAPHY CHEST WITH CONTRAST
TECHNIQUE: Multidetector CT imaging of the chest was performed using the
standard protocol during bolus administration of intravenous
contrast. Multiplanar CT image reconstructions and MIPs were
obtained to evaluate the vascular anatomy.
CONTRAST:  75mL 36PYCP-T5U IOPAMIDOL (36PYCP-T5U) INJECTION 76%

[Series 5: cta thorax 2.00 bv36 s3 axial arterial · axial · arterial · 0.81mm/px · z∈[+1309,+1625]mm · 12 of 188 slices shown]
[im 15/188  lung]
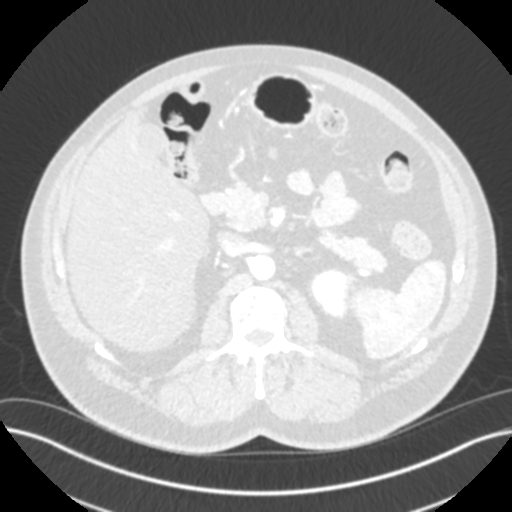
[im 29/188  mediastinal]
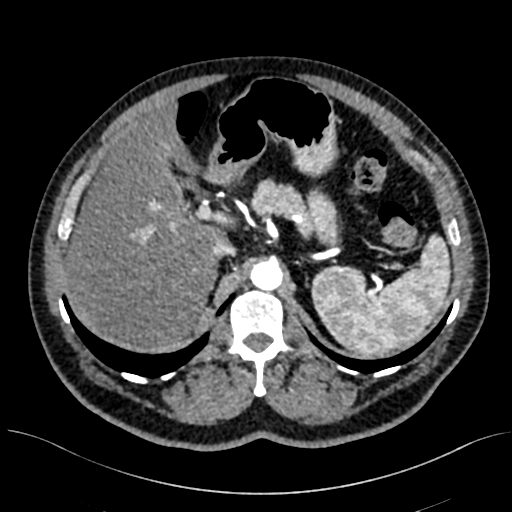
[im 44/188  lung]
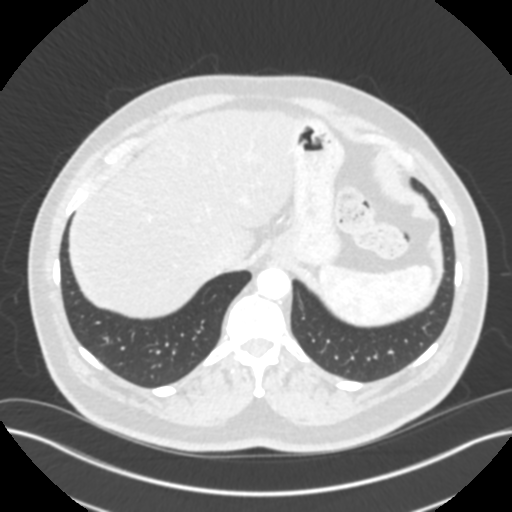
[im 58/188  mediastinal]
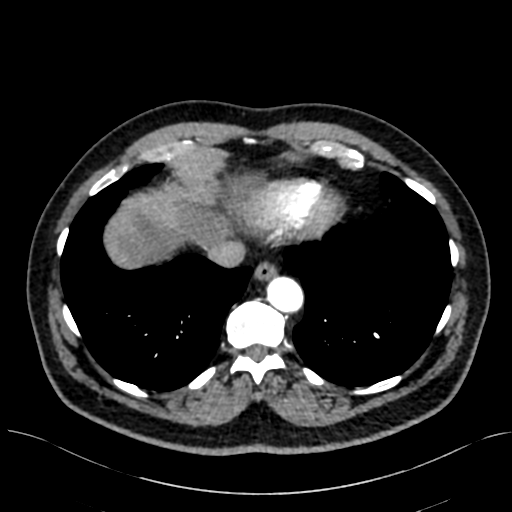
[im 72/188  lung]
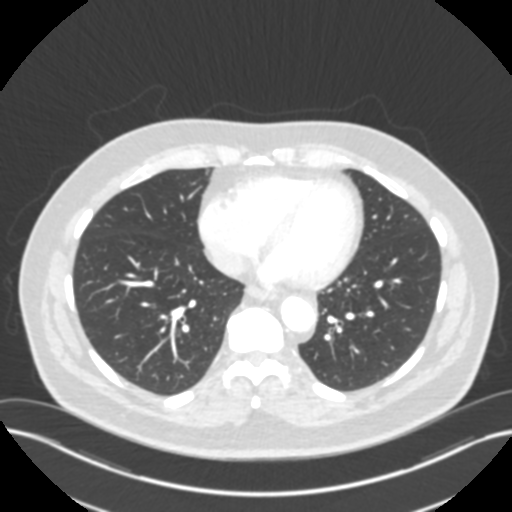
[im 87/188  mediastinal]
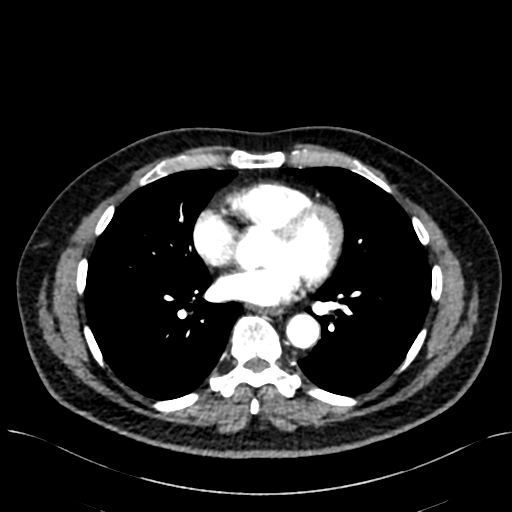
[im 101/188  lung]
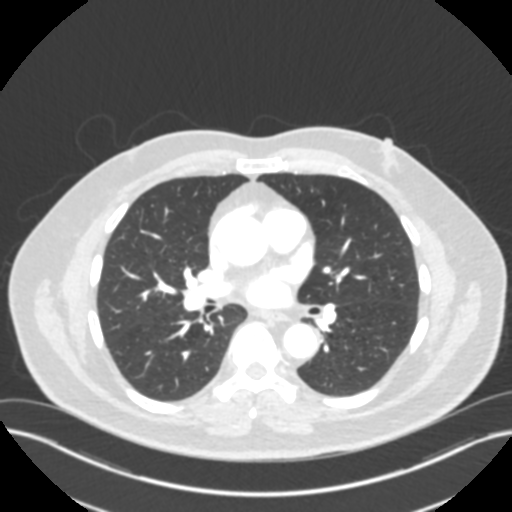
[im 116/188  mediastinal]
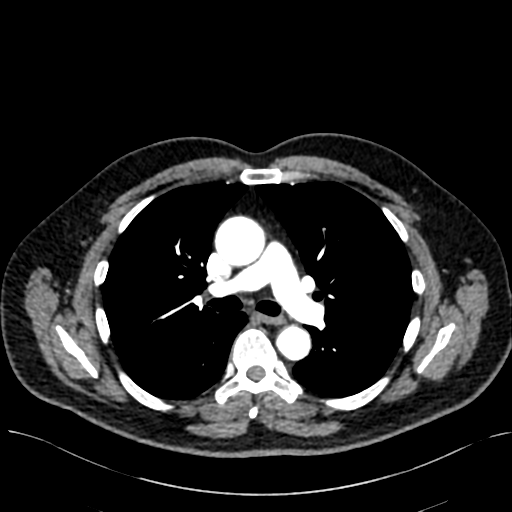
[im 130/188  lung]
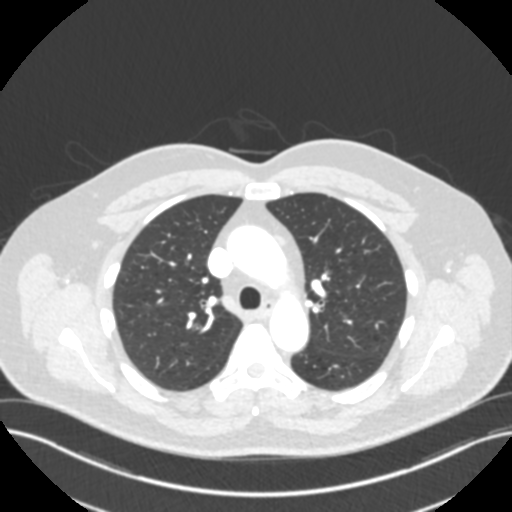
[im 144/188  mediastinal]
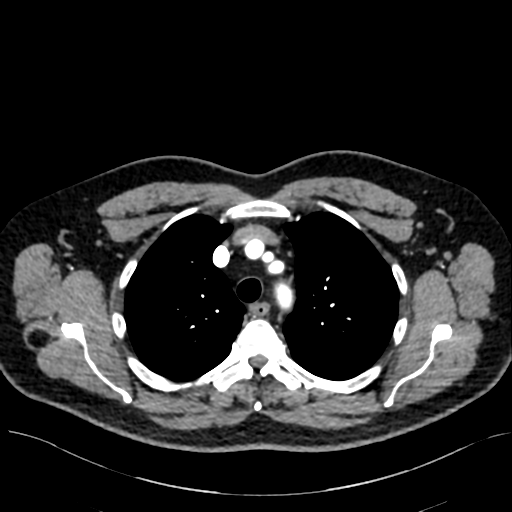
[im 159/188  lung]
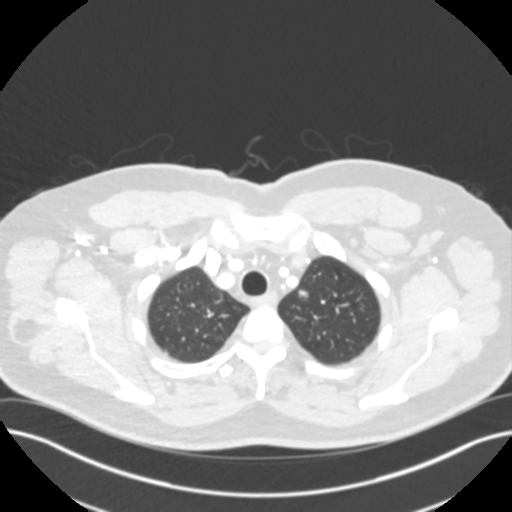
[im 173/188  mediastinal]
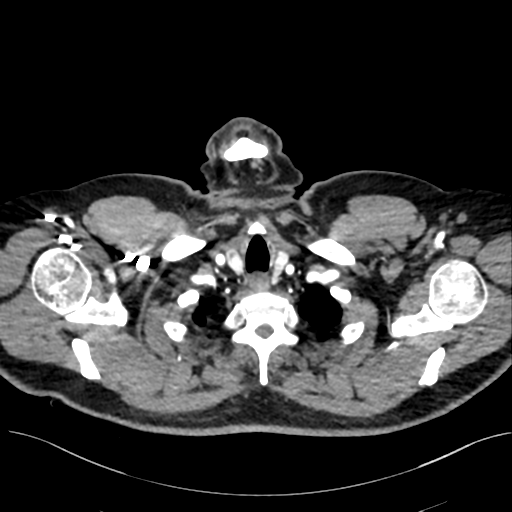

[Series 10: cta thorax 2.00 bv36 s3 cor st · coronal · 0.73mm/px · 1 of 178 slices shown]
[im 89/178  mediastinal]
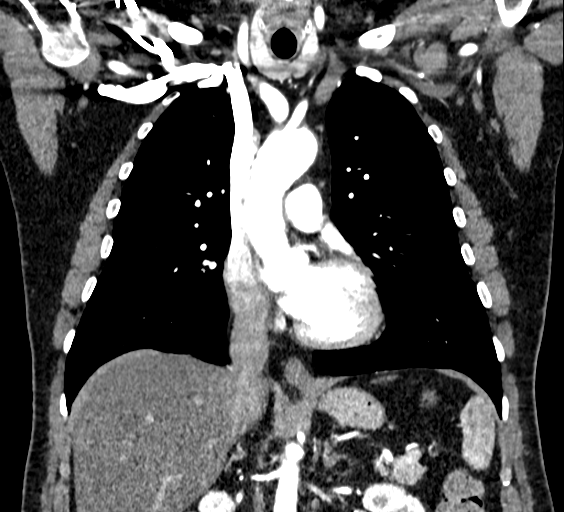

[13 of 36 positions shown; findings below may reference images not displayed]

FINDINGS: Vascular Findings:

Unchanged mild fusiform aneurysmal dilatation of the ascending
thoracic aorta with measurements as follows. The thoracic aorta
tapers to a normal caliber at the level of the aortic arch. No
evidence of thoracic aortic dissection or periaortic stranding on
this nongated examination. There is a minimal amount of mixed
calcified and noncalcified atherosclerotic plaque involving the
aortic arch and descending thoracic aorta, not resulting in a
hemodynamically significant stenosis.

Conventional configuration of the aortic arch. The branch vessels of
the aortic arch appear widely patent throughout their imaged
courses.

Normal heart size.  No pericardial effusion.

Although this examination was not tailored for the evaluation the
pulmonary arteries, there are no discrete filling defects within the
central pulmonary arterial tree to suggest central pulmonary
embolism. Normal caliber of the main pulmonary artery.

-------------------------------------------------------------

Thoracic aortic measurements:

Sinotubular junction

35 mm as measured in greatest oblique short axis coronal dimension.

Proximal ascending aorta

40 mm as measured in greatest oblique short axis axial dimension at
the level of the main pulmonary artery (axial image 78, series 5)
and approximately 40 mm in greatest oblique short axis coronal
diameter (coronal image 82, series 10), unchanged compared to the
[DATE] examination.

Aortic arch aorta

30 mm as measured in greatest oblique short axis sagittal dimension.

Proximal descending thoracic aorta

23 mm as measured in greatest oblique short axis axial dimension at
the level of the main pulmonary artery.

Distal descending thoracic aorta

25 mm as measured in greatest oblique short axis axial dimension at
the level of the diaphragmatic hiatus.

Review of the MIP images confirms the above findings.

-------------------------------------------------------------

Non-Vascular Findings:

Mediastinum/Lymph Nodes: No bulky mediastinal, hilar or axillary
lymphadenopathy.

Lungs/Pleura: No definitive new or enlarging pulmonary nodules.
Punctate (approximately 0.9 cm) nodule involving the medial aspect
of the left lung apex (image 27, series 7) is unchanged compared to
the [DATE] examination was not found to be hypermetabolic
subsequent PET-CT performed [DATE].

Additional punctate (approximately 4 mm) nodule within the right
(images 27 and 49, series 7, and left upper lobes (images 54 and 78,
series 7) are also unchanged compared to the [DATE] exam.

Mild diffuse bronchial wall thickening however the central pulmonary
airways appear widely patent.

No focal airspace opacities.  No pleural effusion or pneumothorax.

Upper abdomen: Limited early arterial phase evaluation of the upper
abdomen demonstrates diffuse decreased attenuation of the hepatic
parenchyma compatible with hepatic steatosis. Previously
characterized partially exophytic cyst arising from the superior
medial aspect the right kidney is incompletely imaged on the present
examination (image 188, series 5).

Musculoskeletal: No acute or aggressive osseous abnormalities. DDD
of the lower cervical spine, incompletely imaged.

Mild symmetric bilateral gynecomastia. Regional soft tissues appear
otherwise normal. Note is made of a punctate (approximately 0.7 cm)
hypoattenuating nodule within the right lobe of the thyroid of
doubtful clinical concern.
IMPRESSION: 1. Stable uncomplicated mild fusiform aneurysmal dilatation the
ascending thoracic aorta measuring 40 mm in diameter. Recommend
annual imaging followup by CTA or MRA. This recommendation follows
5858 ACCF/AHA/AATS/ACR/ASA/SCA/KINVULA/WEKILI/YEMEK/TIGER Guidelines for the
Diagnosis and Management of Patients with Thoracic Aortic Disease.
Circulation. 5858; 121: E266-e369.
2. Aortic aneurysm NOS (3TA1Z-UU3.7)
3.  Aortic Atherosclerosis (3TA1Z-KPH.H).
4. Bilateral pulmonary nodules are unchanged since the [DATE]
of stability and thus a benign etiology.
5. Similar findings suggestive of hepatic steatosis. Further
evaluation with LFTs could be performed as indicated.

## 2021-05-24 DIAGNOSIS — G5731 Lesion of lateral popliteal nerve, right lower limb: Secondary | ICD-10-CM | POA: Diagnosis not present

## 2021-05-25 DIAGNOSIS — K2100 Gastro-esophageal reflux disease with esophagitis, without bleeding: Secondary | ICD-10-CM | POA: Diagnosis not present

## 2021-05-25 DIAGNOSIS — I10 Essential (primary) hypertension: Secondary | ICD-10-CM | POA: Diagnosis not present

## 2021-05-25 DIAGNOSIS — E1169 Type 2 diabetes mellitus with other specified complication: Secondary | ICD-10-CM | POA: Diagnosis not present

## 2021-05-25 DIAGNOSIS — E785 Hyperlipidemia, unspecified: Secondary | ICD-10-CM | POA: Diagnosis not present

## 2021-05-25 DIAGNOSIS — Z85820 Personal history of malignant melanoma of skin: Secondary | ICD-10-CM | POA: Diagnosis not present

## 2021-05-25 DIAGNOSIS — I712 Thoracic aortic aneurysm, without rupture: Secondary | ICD-10-CM | POA: Diagnosis not present

## 2021-05-25 DIAGNOSIS — I700 Atherosclerosis of aorta: Secondary | ICD-10-CM | POA: Diagnosis not present

## 2021-05-31 DIAGNOSIS — C792 Secondary malignant neoplasm of skin: Secondary | ICD-10-CM

## 2021-05-31 DIAGNOSIS — Z8616 Personal history of COVID-19: Secondary | ICD-10-CM

## 2021-05-31 HISTORY — DX: Personal history of COVID-19: Z86.16

## 2021-05-31 HISTORY — DX: Secondary malignant neoplasm of skin: C79.2

## 2021-06-15 DIAGNOSIS — Z85820 Personal history of malignant melanoma of skin: Secondary | ICD-10-CM | POA: Diagnosis not present

## 2021-06-15 DIAGNOSIS — D485 Neoplasm of uncertain behavior of skin: Secondary | ICD-10-CM | POA: Diagnosis not present

## 2021-06-15 DIAGNOSIS — C4359 Malignant melanoma of other part of trunk: Secondary | ICD-10-CM | POA: Diagnosis not present

## 2021-06-28 DIAGNOSIS — D485 Neoplasm of uncertain behavior of skin: Secondary | ICD-10-CM | POA: Diagnosis not present

## 2021-06-28 DIAGNOSIS — C4359 Malignant melanoma of other part of trunk: Secondary | ICD-10-CM | POA: Diagnosis not present

## 2021-07-12 DIAGNOSIS — C4359 Malignant melanoma of other part of trunk: Secondary | ICD-10-CM | POA: Diagnosis not present

## 2021-07-19 DIAGNOSIS — C4359 Malignant melanoma of other part of trunk: Secondary | ICD-10-CM | POA: Diagnosis not present

## 2021-07-19 DIAGNOSIS — M799 Soft tissue disorder, unspecified: Secondary | ICD-10-CM | POA: Diagnosis not present

## 2021-07-19 DIAGNOSIS — R918 Other nonspecific abnormal finding of lung field: Secondary | ICD-10-CM | POA: Diagnosis not present

## 2021-07-19 DIAGNOSIS — R9341 Abnormal radiologic findings on diagnostic imaging of renal pelvis, ureter, or bladder: Secondary | ICD-10-CM | POA: Diagnosis not present

## 2021-08-01 DIAGNOSIS — E119 Type 2 diabetes mellitus without complications: Secondary | ICD-10-CM | POA: Diagnosis not present

## 2021-08-01 DIAGNOSIS — R222 Localized swelling, mass and lump, trunk: Secondary | ICD-10-CM | POA: Diagnosis not present

## 2021-08-01 DIAGNOSIS — Z79899 Other long term (current) drug therapy: Secondary | ICD-10-CM | POA: Diagnosis not present

## 2021-08-01 DIAGNOSIS — C773 Secondary and unspecified malignant neoplasm of axilla and upper limb lymph nodes: Secondary | ICD-10-CM | POA: Diagnosis not present

## 2021-08-01 DIAGNOSIS — Z85820 Personal history of malignant melanoma of skin: Secondary | ICD-10-CM | POA: Diagnosis not present

## 2021-08-01 DIAGNOSIS — Z7984 Long term (current) use of oral hypoglycemic drugs: Secondary | ICD-10-CM | POA: Diagnosis not present

## 2021-08-01 DIAGNOSIS — I10 Essential (primary) hypertension: Secondary | ICD-10-CM | POA: Diagnosis not present

## 2021-08-01 DIAGNOSIS — K219 Gastro-esophageal reflux disease without esophagitis: Secondary | ICD-10-CM | POA: Diagnosis not present

## 2021-08-01 DIAGNOSIS — L905 Scar conditions and fibrosis of skin: Secondary | ICD-10-CM | POA: Diagnosis not present

## 2021-08-01 DIAGNOSIS — C4359 Malignant melanoma of other part of trunk: Secondary | ICD-10-CM | POA: Diagnosis not present

## 2021-08-01 DIAGNOSIS — Z87891 Personal history of nicotine dependence: Secondary | ICD-10-CM | POA: Diagnosis not present

## 2021-08-01 DIAGNOSIS — E7800 Pure hypercholesterolemia, unspecified: Secondary | ICD-10-CM | POA: Diagnosis not present

## 2021-08-01 HISTORY — PX: EXCISION MELANOMA WITH SENTINEL LYMPH NODE BIOPSY: SHX5628

## 2021-08-14 DIAGNOSIS — C4359 Malignant melanoma of other part of trunk: Secondary | ICD-10-CM | POA: Diagnosis not present

## 2021-08-14 DIAGNOSIS — Z09 Encounter for follow-up examination after completed treatment for conditions other than malignant neoplasm: Secondary | ICD-10-CM | POA: Diagnosis not present

## 2021-08-18 DIAGNOSIS — R911 Solitary pulmonary nodule: Secondary | ICD-10-CM | POA: Diagnosis not present

## 2021-08-18 DIAGNOSIS — C779 Secondary and unspecified malignant neoplasm of lymph node, unspecified: Secondary | ICD-10-CM | POA: Diagnosis not present

## 2021-08-18 DIAGNOSIS — C439 Malignant melanoma of skin, unspecified: Secondary | ICD-10-CM | POA: Diagnosis not present

## 2021-08-18 DIAGNOSIS — C4359 Malignant melanoma of other part of trunk: Secondary | ICD-10-CM | POA: Diagnosis not present

## 2021-08-24 DIAGNOSIS — C779 Secondary and unspecified malignant neoplasm of lymph node, unspecified: Secondary | ICD-10-CM | POA: Diagnosis not present

## 2021-08-24 DIAGNOSIS — C4359 Malignant melanoma of other part of trunk: Secondary | ICD-10-CM | POA: Diagnosis not present

## 2021-08-24 DIAGNOSIS — C439 Malignant melanoma of skin, unspecified: Secondary | ICD-10-CM | POA: Diagnosis not present

## 2021-08-24 DIAGNOSIS — Z09 Encounter for follow-up examination after completed treatment for conditions other than malignant neoplasm: Secondary | ICD-10-CM | POA: Diagnosis not present

## 2021-08-29 DIAGNOSIS — C439 Malignant melanoma of skin, unspecified: Secondary | ICD-10-CM | POA: Diagnosis not present

## 2021-08-29 DIAGNOSIS — Z148 Genetic carrier of other disease: Secondary | ICD-10-CM | POA: Diagnosis not present

## 2021-09-18 DIAGNOSIS — C439 Malignant melanoma of skin, unspecified: Secondary | ICD-10-CM | POA: Diagnosis not present

## 2021-09-21 DIAGNOSIS — Z148 Genetic carrier of other disease: Secondary | ICD-10-CM | POA: Diagnosis not present

## 2021-10-02 DIAGNOSIS — Z8489 Family history of other specified conditions: Secondary | ICD-10-CM | POA: Diagnosis not present

## 2021-10-02 DIAGNOSIS — Z7189 Other specified counseling: Secondary | ICD-10-CM | POA: Diagnosis not present

## 2021-10-02 DIAGNOSIS — C439 Malignant melanoma of skin, unspecified: Secondary | ICD-10-CM | POA: Diagnosis not present

## 2021-10-02 DIAGNOSIS — Z8481 Family history of carrier of genetic disease: Secondary | ICD-10-CM | POA: Diagnosis not present

## 2021-10-02 DIAGNOSIS — Z79899 Other long term (current) drug therapy: Secondary | ICD-10-CM | POA: Diagnosis not present

## 2021-10-02 DIAGNOSIS — Z8616 Personal history of COVID-19: Secondary | ICD-10-CM | POA: Diagnosis not present

## 2021-10-10 DIAGNOSIS — J069 Acute upper respiratory infection, unspecified: Secondary | ICD-10-CM | POA: Diagnosis not present

## 2021-10-19 DIAGNOSIS — C439 Malignant melanoma of skin, unspecified: Secondary | ICD-10-CM | POA: Diagnosis not present

## 2021-10-26 DIAGNOSIS — D2271 Melanocytic nevi of right lower limb, including hip: Secondary | ICD-10-CM | POA: Diagnosis not present

## 2021-10-26 DIAGNOSIS — Z85820 Personal history of malignant melanoma of skin: Secondary | ICD-10-CM | POA: Diagnosis not present

## 2021-10-26 DIAGNOSIS — D485 Neoplasm of uncertain behavior of skin: Secondary | ICD-10-CM | POA: Diagnosis not present

## 2021-10-26 DIAGNOSIS — D235 Other benign neoplasm of skin of trunk: Secondary | ICD-10-CM | POA: Diagnosis not present

## 2021-10-26 DIAGNOSIS — D2261 Melanocytic nevi of right upper limb, including shoulder: Secondary | ICD-10-CM | POA: Diagnosis not present

## 2021-10-26 DIAGNOSIS — D2272 Melanocytic nevi of left lower limb, including hip: Secondary | ICD-10-CM | POA: Diagnosis not present

## 2021-10-26 DIAGNOSIS — D225 Melanocytic nevi of trunk: Secondary | ICD-10-CM | POA: Diagnosis not present

## 2021-10-26 DIAGNOSIS — D2262 Melanocytic nevi of left upper limb, including shoulder: Secondary | ICD-10-CM | POA: Diagnosis not present

## 2021-10-26 DIAGNOSIS — Z85828 Personal history of other malignant neoplasm of skin: Secondary | ICD-10-CM | POA: Diagnosis not present

## 2021-11-09 DIAGNOSIS — C439 Malignant melanoma of skin, unspecified: Secondary | ICD-10-CM | POA: Diagnosis not present

## 2021-11-09 DIAGNOSIS — Z5112 Encounter for antineoplastic immunotherapy: Secondary | ICD-10-CM | POA: Diagnosis not present

## 2021-11-09 DIAGNOSIS — Z79899 Other long term (current) drug therapy: Secondary | ICD-10-CM | POA: Diagnosis not present

## 2021-11-10 DIAGNOSIS — C439 Malignant melanoma of skin, unspecified: Secondary | ICD-10-CM | POA: Diagnosis not present

## 2021-11-16 DIAGNOSIS — L988 Other specified disorders of the skin and subcutaneous tissue: Secondary | ICD-10-CM | POA: Diagnosis not present

## 2021-11-16 DIAGNOSIS — D485 Neoplasm of uncertain behavior of skin: Secondary | ICD-10-CM | POA: Diagnosis not present

## 2021-11-16 DIAGNOSIS — Z85820 Personal history of malignant melanoma of skin: Secondary | ICD-10-CM | POA: Diagnosis not present

## 2021-11-22 DIAGNOSIS — I10 Essential (primary) hypertension: Secondary | ICD-10-CM | POA: Diagnosis not present

## 2021-11-22 DIAGNOSIS — Z23 Encounter for immunization: Secondary | ICD-10-CM | POA: Diagnosis not present

## 2021-11-22 DIAGNOSIS — Z1389 Encounter for screening for other disorder: Secondary | ICD-10-CM | POA: Diagnosis not present

## 2021-11-22 DIAGNOSIS — E785 Hyperlipidemia, unspecified: Secondary | ICD-10-CM | POA: Diagnosis not present

## 2021-11-22 DIAGNOSIS — C439 Malignant melanoma of skin, unspecified: Secondary | ICD-10-CM | POA: Diagnosis not present

## 2021-11-22 DIAGNOSIS — Z125 Encounter for screening for malignant neoplasm of prostate: Secondary | ICD-10-CM | POA: Diagnosis not present

## 2021-11-22 DIAGNOSIS — Z86010 Personal history of colonic polyps: Secondary | ICD-10-CM | POA: Diagnosis not present

## 2021-11-22 DIAGNOSIS — Z1331 Encounter for screening for depression: Secondary | ICD-10-CM | POA: Diagnosis not present

## 2021-11-22 DIAGNOSIS — K2100 Gastro-esophageal reflux disease with esophagitis, without bleeding: Secondary | ICD-10-CM | POA: Diagnosis not present

## 2021-11-22 DIAGNOSIS — I712 Thoracic aortic aneurysm, without rupture, unspecified: Secondary | ICD-10-CM | POA: Diagnosis not present

## 2021-11-22 DIAGNOSIS — Z0000 Encounter for general adult medical examination without abnormal findings: Secondary | ICD-10-CM | POA: Diagnosis not present

## 2021-11-22 DIAGNOSIS — E1169 Type 2 diabetes mellitus with other specified complication: Secondary | ICD-10-CM | POA: Diagnosis not present

## 2021-11-22 DIAGNOSIS — I700 Atherosclerosis of aorta: Secondary | ICD-10-CM | POA: Diagnosis not present

## 2021-12-04 DIAGNOSIS — Z5112 Encounter for antineoplastic immunotherapy: Secondary | ICD-10-CM | POA: Diagnosis not present

## 2021-12-04 DIAGNOSIS — R058 Other specified cough: Secondary | ICD-10-CM | POA: Diagnosis not present

## 2021-12-04 DIAGNOSIS — R918 Other nonspecific abnormal finding of lung field: Secondary | ICD-10-CM | POA: Diagnosis not present

## 2021-12-04 DIAGNOSIS — Z7189 Other specified counseling: Secondary | ICD-10-CM | POA: Diagnosis not present

## 2021-12-04 DIAGNOSIS — Z79899 Other long term (current) drug therapy: Secondary | ICD-10-CM | POA: Diagnosis not present

## 2021-12-04 DIAGNOSIS — N289 Disorder of kidney and ureter, unspecified: Secondary | ICD-10-CM | POA: Diagnosis not present

## 2021-12-04 DIAGNOSIS — R7309 Other abnormal glucose: Secondary | ICD-10-CM | POA: Diagnosis not present

## 2021-12-04 DIAGNOSIS — Z7185 Encounter for immunization safety counseling: Secondary | ICD-10-CM | POA: Diagnosis not present

## 2021-12-04 DIAGNOSIS — C439 Malignant melanoma of skin, unspecified: Secondary | ICD-10-CM | POA: Diagnosis not present

## 2021-12-04 DIAGNOSIS — Z803 Family history of malignant neoplasm of breast: Secondary | ICD-10-CM | POA: Diagnosis not present

## 2021-12-04 DIAGNOSIS — R234 Changes in skin texture: Secondary | ICD-10-CM | POA: Diagnosis not present

## 2021-12-04 DIAGNOSIS — Z792 Long term (current) use of antibiotics: Secondary | ICD-10-CM | POA: Diagnosis not present

## 2021-12-13 ENCOUNTER — Encounter: Payer: Self-pay | Admitting: Internal Medicine

## 2021-12-21 DIAGNOSIS — C439 Malignant melanoma of skin, unspecified: Secondary | ICD-10-CM | POA: Diagnosis not present

## 2021-12-21 DIAGNOSIS — R5383 Other fatigue: Secondary | ICD-10-CM | POA: Diagnosis not present

## 2021-12-21 DIAGNOSIS — E1169 Type 2 diabetes mellitus with other specified complication: Secondary | ICD-10-CM | POA: Diagnosis not present

## 2021-12-21 DIAGNOSIS — K625 Hemorrhage of anus and rectum: Secondary | ICD-10-CM | POA: Diagnosis not present

## 2021-12-21 DIAGNOSIS — I10 Essential (primary) hypertension: Secondary | ICD-10-CM | POA: Diagnosis not present

## 2021-12-27 DIAGNOSIS — E669 Obesity, unspecified: Secondary | ICD-10-CM | POA: Diagnosis not present

## 2021-12-27 DIAGNOSIS — Z20822 Contact with and (suspected) exposure to covid-19: Secondary | ICD-10-CM | POA: Diagnosis not present

## 2021-12-27 DIAGNOSIS — K219 Gastro-esophageal reflux disease without esophagitis: Secondary | ICD-10-CM | POA: Diagnosis not present

## 2021-12-27 DIAGNOSIS — Z8249 Family history of ischemic heart disease and other diseases of the circulatory system: Secondary | ICD-10-CM | POA: Diagnosis not present

## 2021-12-27 DIAGNOSIS — Z809 Family history of malignant neoplasm, unspecified: Secondary | ICD-10-CM | POA: Diagnosis not present

## 2021-12-27 DIAGNOSIS — E785 Hyperlipidemia, unspecified: Secondary | ICD-10-CM | POA: Diagnosis not present

## 2021-12-27 DIAGNOSIS — Z833 Family history of diabetes mellitus: Secondary | ICD-10-CM | POA: Diagnosis not present

## 2021-12-27 DIAGNOSIS — Z791 Long term (current) use of non-steroidal anti-inflammatories (NSAID): Secondary | ICD-10-CM | POA: Diagnosis not present

## 2021-12-27 DIAGNOSIS — Z823 Family history of stroke: Secondary | ICD-10-CM | POA: Diagnosis not present

## 2021-12-27 DIAGNOSIS — I10 Essential (primary) hypertension: Secondary | ICD-10-CM | POA: Diagnosis not present

## 2021-12-27 DIAGNOSIS — Z7984 Long term (current) use of oral hypoglycemic drugs: Secondary | ICD-10-CM | POA: Diagnosis not present

## 2021-12-27 DIAGNOSIS — E119 Type 2 diabetes mellitus without complications: Secondary | ICD-10-CM | POA: Diagnosis not present

## 2022-01-04 DIAGNOSIS — Z79899 Other long term (current) drug therapy: Secondary | ICD-10-CM | POA: Diagnosis not present

## 2022-01-04 DIAGNOSIS — Z5112 Encounter for antineoplastic immunotherapy: Secondary | ICD-10-CM | POA: Diagnosis not present

## 2022-01-04 DIAGNOSIS — C439 Malignant melanoma of skin, unspecified: Secondary | ICD-10-CM | POA: Diagnosis not present

## 2022-01-10 ENCOUNTER — Encounter: Payer: Self-pay | Admitting: *Deleted

## 2022-01-11 DIAGNOSIS — I10 Essential (primary) hypertension: Secondary | ICD-10-CM | POA: Diagnosis not present

## 2022-01-11 DIAGNOSIS — J3489 Other specified disorders of nose and nasal sinuses: Secondary | ICD-10-CM | POA: Diagnosis not present

## 2022-01-18 DIAGNOSIS — Z0100 Encounter for examination of eyes and vision without abnormal findings: Secondary | ICD-10-CM | POA: Diagnosis not present

## 2022-01-22 DIAGNOSIS — J310 Chronic rhinitis: Secondary | ICD-10-CM | POA: Diagnosis not present

## 2022-01-22 DIAGNOSIS — J0141 Acute recurrent pansinusitis: Secondary | ICD-10-CM | POA: Diagnosis not present

## 2022-01-22 DIAGNOSIS — J342 Deviated nasal septum: Secondary | ICD-10-CM | POA: Diagnosis not present

## 2022-01-22 DIAGNOSIS — J343 Hypertrophy of nasal turbinates: Secondary | ICD-10-CM | POA: Diagnosis not present

## 2022-01-31 DIAGNOSIS — C439 Malignant melanoma of skin, unspecified: Secondary | ICD-10-CM | POA: Diagnosis not present

## 2022-02-01 ENCOUNTER — Ambulatory Visit (AMBULATORY_SURGERY_CENTER): Payer: Medicare HMO | Admitting: *Deleted

## 2022-02-01 ENCOUNTER — Other Ambulatory Visit: Payer: Self-pay

## 2022-02-01 VITALS — Ht 69.0 in | Wt 210.0 lb

## 2022-02-01 DIAGNOSIS — Z86010 Personal history of colonic polyps: Secondary | ICD-10-CM

## 2022-02-01 NOTE — Progress Notes (Signed)

## 2022-02-05 DIAGNOSIS — Z79899 Other long term (current) drug therapy: Secondary | ICD-10-CM | POA: Diagnosis not present

## 2022-02-05 DIAGNOSIS — Z5112 Encounter for antineoplastic immunotherapy: Secondary | ICD-10-CM | POA: Diagnosis not present

## 2022-02-05 DIAGNOSIS — C439 Malignant melanoma of skin, unspecified: Secondary | ICD-10-CM | POA: Diagnosis not present

## 2022-02-05 DIAGNOSIS — Z7984 Long term (current) use of oral hypoglycemic drugs: Secondary | ICD-10-CM | POA: Diagnosis not present

## 2022-02-05 DIAGNOSIS — N289 Disorder of kidney and ureter, unspecified: Secondary | ICD-10-CM | POA: Diagnosis not present

## 2022-02-05 DIAGNOSIS — J0101 Acute recurrent maxillary sinusitis: Secondary | ICD-10-CM | POA: Diagnosis not present

## 2022-02-05 DIAGNOSIS — R918 Other nonspecific abnormal finding of lung field: Secondary | ICD-10-CM | POA: Diagnosis not present

## 2022-02-05 DIAGNOSIS — Z803 Family history of malignant neoplasm of breast: Secondary | ICD-10-CM | POA: Diagnosis not present

## 2022-02-13 ENCOUNTER — Encounter: Payer: Self-pay | Admitting: Internal Medicine

## 2022-02-15 ENCOUNTER — Encounter: Payer: Self-pay | Admitting: Internal Medicine

## 2022-02-15 ENCOUNTER — Ambulatory Visit (AMBULATORY_SURGERY_CENTER): Payer: Medicare HMO | Admitting: Internal Medicine

## 2022-02-15 ENCOUNTER — Other Ambulatory Visit: Payer: Self-pay

## 2022-02-15 VITALS — BP 112/62 | HR 62 | Temp 98.6°F | Resp 16 | Ht 69.0 in | Wt 210.0 lb

## 2022-02-15 DIAGNOSIS — Z86010 Personal history of colonic polyps: Secondary | ICD-10-CM | POA: Diagnosis not present

## 2022-02-15 DIAGNOSIS — D127 Benign neoplasm of rectosigmoid junction: Secondary | ICD-10-CM | POA: Diagnosis not present

## 2022-02-15 DIAGNOSIS — D128 Benign neoplasm of rectum: Secondary | ICD-10-CM

## 2022-02-15 DIAGNOSIS — D122 Benign neoplasm of ascending colon: Secondary | ICD-10-CM

## 2022-02-15 DIAGNOSIS — D124 Benign neoplasm of descending colon: Secondary | ICD-10-CM | POA: Diagnosis not present

## 2022-02-15 DIAGNOSIS — D125 Benign neoplasm of sigmoid colon: Secondary | ICD-10-CM

## 2022-02-15 DIAGNOSIS — I10 Essential (primary) hypertension: Secondary | ICD-10-CM | POA: Diagnosis not present

## 2022-02-15 DIAGNOSIS — J45909 Unspecified asthma, uncomplicated: Secondary | ICD-10-CM | POA: Diagnosis not present

## 2022-02-15 HISTORY — PX: COLONOSCOPY WITH PROPOFOL: SHX5780

## 2022-02-15 MED ORDER — SODIUM CHLORIDE 0.9 % IV SOLN: 500.0000 mL | Freq: Once | INTRAVENOUS | Status: DC

## 2022-02-15 NOTE — Progress Notes (Signed)
Report given to PACU, vss 

## 2022-02-15 NOTE — Progress Notes (Signed)
[facility] History and Physical   Primary Care Physician:  Gaynelle Arabian, MD   Reason for Procedure:   Hx colon polyps, FHx CRCA and CHEK mutation  Plan:    colonoscopy     HPI: Jesse Perez is a 71 y.o. male here for surveillance colonoscopy exam. 3 polyps removed 4/06, max size 12 mm and worst pathology     tubulovillous adenoma 2010 3 mm hyperplastic 10/03/2015 7 polyps max 10 mm- 5 retrieved 2 diminutive not 4-5 were adenomas on path, had post-polypectomy bleed 10/22/2018 4 diminutive descending polyps removed - adenomas   Interval dx and treatment melanoma - immunotx [facility] Past Medical History:  Diagnosis Date   Allergy    Asthma    AS A CHILD   Bradycardia    Diabetes mellitus    ON METFORMIN   Family history of colon cancer - grandfather and CHEK mutation increasing colon cancer risk 10/03/2015   GERD (gastroesophageal reflux disease)    Hemochromatosis carrier    Hyperlipidemia    Hypertension    Immunotherapy    OPTIVO ON A MONTHLY BASIS FOR MELANOMA STAGE III   Melanoma ([place])    STAGE III   Obstructive sleep apnea    does not use CPAP   Prostate cancer ([place]) 2011   Gleason score=3   Shingles     Past Surgical History:  Procedure Laterality Date   APPENDECTOMY     BACK SURGERY     COLONOSCOPY     COLONOSCOPY WITH PROPOFOL N/A 10/11/2015   Procedure: COLONOSCOPY WITH PROPOFOL;  Surgeon: Irene Shipper, MD;  Location: [facility];  Service: Endoscopy;  Laterality: N/A;   excision of melanoma     POLYPECTOMY     PROSTATE SURGERY     US ECHOCARDIOGRAPHY  09/05/2011   borderline aortic root dilatation    Prior to Admission medications   Medication Sig Start Date End Date Taking? Authorizing Provider  amLODipine (NORVASC) 5 MG tablet Take by mouth. 11/16/21  Yes [provider]  fluticasone (FLONASE) 50 MCG/ACT nasal spray SMARTSIG:2 Spray(s) Both Nares Every Night 01/22/22  Yes [provider]   losartan-hydrochlorothiazide (HYZAAR) 100-12.5 MG tablet Take 1 tablet by mouth daily. 01/19/22  Yes [provider]  metFORMIN (GLUCOPHAGE-XR) 500 MG 24 hr tablet Take 1,000 mg by mouth daily. 01/21/22  Yes [provider]  albuterol (VENTOLIN HFA) 108 (90 Base) MCG/ACT inhaler SMARTSIG:2 Puff(s) By Mouth Every 6 Hours PRN 01/04/22   [provider]  desonide (DESOWEN) 0.05 % lotion Apply topically as directed. 11/16/21   [provider]  ibuprofen (ADVIL) 200 MG tablet Take by mouth.    [provider]  loratadine (CLARITIN) 10 MG tablet Take 10 mg by mouth daily as needed for allergies.     [provider]  Naproxen Sodium 220 MG CAPS Take by mouth.    [provider]  omeprazole (PRILOSEC) 40 MG capsule Take 1 capsule by mouth 2 (two) times daily. 08/18/19   [provider]  rosuvastatin (CRESTOR) 5 MG tablet Take 5 mg by mouth once a week. Patient not taking: Reported on 02/15/2022    [provider]    Current Outpatient Medications  Medication Sig Dispense Refill   amLODipine (NORVASC) 5 MG tablet Take by mouth.     fluticasone (FLONASE) 50 MCG/ACT nasal spray SMARTSIG:2 Spray(s) Both Nares Every Night     losartan-hydrochlorothiazide (HYZAAR) 100-12.5 MG tablet Take 1 tablet by mouth daily.  metFORMIN (GLUCOPHAGE-XR) 500 MG 24 hr tablet Take 1,000 mg by mouth daily.     albuterol (VENTOLIN HFA) 108 (90 Base) MCG/ACT inhaler SMARTSIG:2 Puff(s) By Mouth Every 6 Hours PRN     desonide (DESOWEN) 0.05 % lotion Apply topically as directed.     ibuprofen (ADVIL) 200 MG tablet Take by mouth.     loratadine (CLARITIN) 10 MG tablet Take 10 mg by mouth daily as needed for allergies.      Naproxen Sodium 220 MG CAPS Take by mouth.     omeprazole (PRILOSEC) 40 MG capsule Take 1 capsule by mouth 2 (two) times daily.     rosuvastatin (CRESTOR) 5 MG tablet Take 5 mg by mouth once a week. (Patient not taking: Reported on  02/15/2022)     Current Facility-Administered Medications  Medication Dose Route Frequency Provider Last Rate Last Admin   0.9 %  sodium chloride infusion  500 mL Intravenous Once Gatha Mayer, MD        Allergies as of 02/15/2022 - Review Complete 02/15/2022  Allergen Reaction Noted   Penicillins Anaphylaxis 11/09/2009   Zolpidem tartrate Anaphylaxis 08/27/2011   Metoprolol  11/22/2021    Family History  Problem Relation Age of Onset   Breast cancer Mother 42   Cancer Mother    Hypertension Father    Stroke Father    Breast cancer Sister 18       BRCA neg   Colon polyps Sister    Breast cancer Maternal Grandmother        dx in her 54s   Breast cancer Daughter 47       ATM and CHEK2 carrier   Hemochromatosis Daughter 65       C282Y and S65C double heterozygote   Colon cancer Paternal Grandfather    Crohn's disease Neg Hx    Liver cancer Neg Hx    Pancreatic cancer Neg Hx    Rectal cancer Neg Hx    Stomach cancer Neg Hx    Ulcerative colitis Neg Hx    Esophageal cancer Neg Hx     Social History   Socioeconomic History   Marital status: Married    Spouse name: Not on file   Number of children: 3   Years of education: Not on file   Highest education level: Not on file  Occupational History    Employer: [employer]  Tobacco Use   Smoking status: Former    Types: Cigarettes    Quit date: 12/31/1996    Years since quitting: 25.1   Smokeless tobacco: Never  Vaping Use   Vaping Use: Never used  Substance and Sexual Activity   Alcohol use: Yes    Alcohol/week: 0.0 standard drinks    Comment: weekends only   Drug use: No     Review of Systems:  All other review of systems negative except as mentioned in the HPI.  Physical Exam: Vital signs BP (!) 160/88    Pulse 67    Temp 98.6 F (37 C)    Resp 15    Ht '5\' 9"'  (1.753 m)    Wt 210 lb (95.3 kg)    SpO2 97%    BMI 31.01 kg/m   General:   Alert,  Well-developed, well-nourished, pleasant and cooperative in  NAD Lungs:  Clear throughout to auscultation.   Heart:  Regular rate and rhythm; no murmurs, clicks, rubs,  or gallops. Abdomen:  Soft, nontender and nondistended. Normal bowel sounds.   Neuro/Psych:  Alert and cooperative. Normal mood and affect. A and O x 3   '@Brance Dartt'  Simonne Maffucci, MD, [facility] [phone] (pager) 02/15/2022 9:04 AM@

## 2022-02-15 NOTE — Patient Instructions (Addendum)
I found and removed 11 tiny polyps today - all look benign. I will let you know pathology results and when to have another routine colonoscopy by mail and/or My Chart.  I appreciate the opportunity to care for you. Gatha Mayer, MD, [facility]     Handouts were given to your care partner on polyps, diverticulosis, and hemorrhoids. Your sugar was 153 in the recovery room. You may resume your current medications today. Await biopsy results.  May take 1-3 weeks to receive pathology results. The office nurse will call you to set up a referral for surgical consult. Please call if any questions or concerns.      YOU HAD AN ENDOSCOPIC PROCEDURE TODAY AT [facility]:   Refer to the procedure report that was given to you for any specific questions about what was found during the examination.  If the procedure report does not answer your questions, please call your gastroenterologist to clarify.  If you requested that your care partner not be given the details of your procedure findings, then the procedure report has been included in a sealed envelope for you to review at your convenience later.  YOU SHOULD EXPECT: Some feelings of bloating in the abdomen. Passage of more gas than usual.  Walking can help get rid of the air that was put into your GI tract during the procedure and reduce the bloating. If you had a lower endoscopy (such as a colonoscopy or flexible sigmoidoscopy) you may notice spotting of blood in your stool or on the toilet paper. If you underwent a bowel prep for your procedure, you may not have a normal bowel movement for a few days.  Please Note:  You might notice some irritation and congestion in your nose or some drainage.  This is from the oxygen used during your procedure.  There is no need for concern and it should clear up in a day or so.  SYMPTOMS TO REPORT IMMEDIATELY:  Following lower endoscopy (colonoscopy or flexible sigmoidoscopy):  Excessive amounts of  blood in the stool  Significant tenderness or worsening of abdominal pains  Swelling of the abdomen that is new, acute  Fever of 100F or higher   For urgent or emergent issues, a gastroenterologist can be reached at any hour by calling [phone]. Do not use MyChart messaging for urgent concerns.    DIET:  We do recommend a small meal at first, but then you may proceed to your regular diet.  Drink plenty of fluids but you should avoid alcoholic beverages for 24 hours.  ACTIVITY:  You should plan to take it easy for the rest of today and you should NOT DRIVE or use heavy machinery until tomorrow (because of the sedation medicines used during the test).    FOLLOW UP: Our staff will call the number listed on your records 48-72 hours following your procedure to check on you and address any questions or concerns that you may have regarding the information given to you following your procedure. If we do not reach you, we will leave a message.  We will attempt to reach you two times.  During this call, we will ask if you have developed any symptoms of COVID 19. If you develop any symptoms (ie: fever, flu-like symptoms, shortness of breath, cough etc.) before then, please call [phone].  If you test positive for Covid 19 in the 2 weeks post procedure, please call and report this information to [place].    If any  biopsies were taken you will be contacted by phone or by letter within the next 1-3 weeks.  Please call us at [phone] if you have not heard about the biopsies in 3 weeks.    SIGNATURES/CONFIDENTIALITY: You and/or your care partner have signed paperwork which will be entered into your electronic medical record.  These signatures attest to the fact that that the information above on your After Visit Summary has been reviewed and is understood.  Full responsibility of the confidentiality of this discharge information lies with you and/or your care-partner.

## 2022-02-15 NOTE — Progress Notes (Signed)
Vital signs checked by:CW  The patient states no changes in medical or surgical history since pre-visit screening on 02/01/22.

## 2022-02-15 NOTE — Op Note (Signed)
[facility] Patient Name: Jesse Perez Procedure Date: 02/15/2022 8:58 AM MRN: [id number] Endoscopist: Gatha Mayer , MD Age: 71 Referring MD:  Date of Birth: Nov 11, 1951 Gender: Male Account #: [id number] Procedure:                Colonoscopy Indications:              Surveillance: Personal history of adenomatous                            polyps on last colonoscopy > 3 years ago Medicines:                Propofol per Anesthesia, Monitored Anesthesia Care Procedure:                Pre-Anesthesia Assessment:                           - Prior to the procedure, a History and Physical                            was performed, and patient medications and                            allergies were reviewed. The patient's tolerance of                            previous anesthesia was also reviewed. The risks                            and benefits of the procedure and the sedation                            options and risks were discussed with the patient.                            All questions were answered, and informed consent                            was obtained. Prior Anticoagulants: The patient has                            taken no previous anticoagulant or antiplatelet                            agents. ASA Grade Assessment: II - A patient with                            mild systemic disease. After reviewing the risks                            and benefits, the patient was deemed in                            satisfactory condition to undergo the procedure.  After obtaining informed consent, the colonoscope                            was passed under direct vision. Throughout the                            procedure, the patient's blood pressure, pulse, and                            oxygen saturations were monitored continuously. The                            CF [id number] #[id number] was introduced through the anus                             and advanced to the the cecum, identified by                            appendiceal orifice and ileocecal valve. The                            colonoscopy was performed without difficulty. The                            patient tolerated the procedure well. The quality                            of the bowel preparation was good. The ileocecal                            valve, appendiceal orifice, and rectum were                            photographed. Scope In: 9:19:23 AM Scope Out: 9:50:16 AM Scope Withdrawal Time: 0 hours 21 minutes 48 seconds  Total Procedure Duration: 0 hours 30 minutes 53 seconds  Findings:                 11 sessile polyps were found in the rectum, sigmoid                            colon, descending colon and ascending colon. The                            polyps were diminutive in size. These polyps were                            removed with a cold snare. Resection and retrieval                            were complete. Verification of patient                            identification for the specimen was done. Estimated  blood loss was minimal.                           A few small-mouthed diverticula were found in the                            sigmoid colon.                           External hemorrhoids were found.                           Anal papilla(e) were hypertrophied.                           Skin tags were found on perianal exam.                           The digital rectal exam was normal. Complications:            No immediate complications. Estimated Blood Loss:     Estimated blood loss was minimal. Impression:               - 11 diminutive polyps in the rectum, in the                            sigmoid colon, in the descending colon and in the                            ascending colon, removed with a cold snare.                            Resected and retrieved.                           - Diverticulosis in  the sigmoid colon.                           - External hemorrhoids.                           - Anal papilla(e) were hypertrophied.                           - Perianal skin tags found on perianal exam.                           - Personal history of colonic polyps.                           3 polyps removed 4/06, max size 12 mm and worst                            pathology                           tubulovillous adenoma  2010 3 mm hyperplastic                           10/03/2015 7 polyps max 10 mm- 5 retrieved 2                            diminutive not 4-5 were adenomas on path, had                            post-polypectomy bleed                           10/22/2018 4 diminutive descending polyps removed -                            adenomas                           - CHEK mutation and FHx CRCA grandfather Recommendation:           - Patient has a contact number available for                            emergencies. The signs and symptoms of potential                            delayed complications were discussed with the                            patient. Return to normal activities tomorrow.                            Written discharge instructions were provided to the                            patient.                           - Resume previous diet.                           - Continue present medications.                           - Await pathology results.                           - Repeat colonoscopy for surveillance. Gatha Mayer, MD 02/15/2022 9:58:57 AM This report has been signed electronically.

## 2022-02-15 NOTE — Progress Notes (Signed)
Called to room to assist during endoscopic procedure.  Patient ID and intended procedure confirmed with present staff. Received instructions for my participation in the procedure from the performing physician.  

## 2022-02-15 NOTE — Progress Notes (Signed)
No problems noted in the recovery room. maw 

## 2022-02-16 ENCOUNTER — Telehealth: Payer: Self-pay

## 2022-02-16 NOTE — Telephone Encounter (Signed)
-----   Message from Gatha Mayer, MD sent at 02/16/2022 12:40 PM EST ----- Regarding: RE: Question: Referral to Surgeon He needs referral to [facility] for evaluation and treatment of anal skin tag/external hemorrhoid  Refer to Colorectal surgeon team    ----- Message ----- From: Gillermina Hu, RN Sent: 02/16/2022   9:40 AM EST To: Gatha Mayer, MD Subject: Question: Referral to Surgeon                  Hand written note under Recommendations of Pt recent Colonoscopy:  "Referral to surgeon: Pt interested removal of Colon. Office to Schedule" Please Advise   Remo Lipps RN

## 2022-02-16 NOTE — Telephone Encounter (Signed)
Referral and Pt records sent to [facility] for evaluation and treatment of anal skin tag/external hemorrhoid: Refer to Colorectal surgeon team Pt made aware:Pt verbalized understanding with all questions answered.

## 2022-02-16 NOTE — Telephone Encounter (Signed)
Referral and Pt records sent to [facility] for evaluation and treatment of anal skin tag/external hemorrhoid: Refer to Colorectal surgeon team   Left message for pt to call back:

## 2022-02-20 ENCOUNTER — Telehealth: Payer: Self-pay

## 2022-02-20 NOTE — Telephone Encounter (Signed)
°  Follow up Call-  Call back number 02/15/2022  Post procedure Call Back phone  # [phone]  Permission to leave phone message Yes  Some recent data might be hidden     Patient questions:  Do you have a fever, pain , or abdominal swelling? No. Pain Score  0 *  Have you tolerated food without any problems? yes  Have you been able to return to your normal activities? Yes.    Do you have any questions about your discharge instructions: Diet   No. Medications  No. Follow up visit  No.  Do you have questions or concerns about your Care? No.  Actions: * If pain score is 4 or above: No action needed, pain <4.

## 2022-02-21 ENCOUNTER — Encounter: Payer: Self-pay | Admitting: Internal Medicine

## 2022-03-01 DIAGNOSIS — C439 Malignant melanoma of skin, unspecified: Secondary | ICD-10-CM | POA: Diagnosis not present

## 2022-03-01 DIAGNOSIS — Z79899 Other long term (current) drug therapy: Secondary | ICD-10-CM | POA: Diagnosis not present

## 2022-03-01 DIAGNOSIS — R918 Other nonspecific abnormal finding of lung field: Secondary | ICD-10-CM | POA: Diagnosis not present

## 2022-03-01 DIAGNOSIS — Z5111 Encounter for antineoplastic chemotherapy: Secondary | ICD-10-CM | POA: Diagnosis not present

## 2022-03-01 DIAGNOSIS — M799 Soft tissue disorder, unspecified: Secondary | ICD-10-CM | POA: Diagnosis not present

## 2022-03-15 DIAGNOSIS — K603 Anal fistula: Secondary | ICD-10-CM | POA: Diagnosis not present

## 2022-03-29 DIAGNOSIS — Z803 Family history of malignant neoplasm of breast: Secondary | ICD-10-CM | POA: Diagnosis not present

## 2022-03-29 DIAGNOSIS — K603 Anal fistula: Secondary | ICD-10-CM | POA: Diagnosis not present

## 2022-03-29 DIAGNOSIS — Z7984 Long term (current) use of oral hypoglycemic drugs: Secondary | ICD-10-CM | POA: Diagnosis not present

## 2022-03-29 DIAGNOSIS — Z79899 Other long term (current) drug therapy: Secondary | ICD-10-CM | POA: Diagnosis not present

## 2022-03-29 DIAGNOSIS — J329 Chronic sinusitis, unspecified: Secondary | ICD-10-CM | POA: Diagnosis not present

## 2022-03-29 DIAGNOSIS — Z5112 Encounter for antineoplastic immunotherapy: Secondary | ICD-10-CM | POA: Diagnosis not present

## 2022-03-29 DIAGNOSIS — C439 Malignant melanoma of skin, unspecified: Secondary | ICD-10-CM | POA: Diagnosis not present

## 2022-04-02 ENCOUNTER — Other Ambulatory Visit: Payer: Self-pay

## 2022-04-02 ENCOUNTER — Encounter (HOSPITAL_BASED_OUTPATIENT_CLINIC_OR_DEPARTMENT_OTHER): Payer: Self-pay | Admitting: Surgery

## 2022-04-02 NOTE — Progress Notes (Addendum)
Spoke w/ via phone for pre-op interview--- ?Lab needs dos----  [employer] and ekg (per anes)/  pre-op orders pending           ?Lab results------ pt has lab results in epic 03-29-2022 this was before his chemo  ?COVID test -----patient states asymptomatic no test needed ?Arrive at ------- 1115 on 04-09-2022 ?NPO after MN NO Solid Food.  Clear liquids from MN until--- 1015 ?Med rec completed ?Medications to take morning of surgery ----- crestor, prilosec ?Diabetic medication ----- do not take metformin morning of surgery ?Patient instructed no nail polish to be worn day of surgery ?Patient instructed to bring photo id and insurance card day of surgery ?Patient aware to have Driver (ride ) / caregiver  for 24 hours after surgery --wife , judy ?Patient Special Instructions ----- asked to bring rescue inhaler dos ?Pre-Op special Istructions ----- sent inbox message to dr white in epic , requested pre-op orders ?Patient verbalized understanding of instructions that were given at this phone interview. ?Patient denies shortness of breath, chest pain, fever, cough at this phone interview.  ?

## 2022-04-05 ENCOUNTER — Ambulatory Visit: Payer: Self-pay | Admitting: Surgery

## 2022-04-05 DIAGNOSIS — Z01818 Encounter for other preprocedural examination: Secondary | ICD-10-CM

## 2022-04-05 NOTE — Progress Notes (Signed)
Patient already informed of time change to 0900. Verified understanding of arrival time at 0700, clear liquids until 0600., and may take am meds as previously instructed. ?

## 2022-04-09 ENCOUNTER — Encounter (HOSPITAL_BASED_OUTPATIENT_CLINIC_OR_DEPARTMENT_OTHER)
Admission: RE | Disposition: A | Discharge status: Home / Self Care | Payer: Self-pay | Source: Home / Self Care | Attending: Surgery

## 2022-04-09 ENCOUNTER — Ambulatory Visit (HOSPITAL_BASED_OUTPATIENT_CLINIC_OR_DEPARTMENT_OTHER)
Admission: RE | Admit: 2022-04-09 | Discharge: 2022-04-09 | Disposition: A | Discharge status: Home / Self Care | Payer: Medicare HMO | Attending: Surgery | Admitting: Surgery

## 2022-04-09 ENCOUNTER — Ambulatory Visit (HOSPITAL_BASED_OUTPATIENT_CLINIC_OR_DEPARTMENT_OTHER): Payer: Medicare HMO | Admitting: Anesthesiology

## 2022-04-09 ENCOUNTER — Other Ambulatory Visit: Payer: Self-pay

## 2022-04-09 ENCOUNTER — Encounter (HOSPITAL_BASED_OUTPATIENT_CLINIC_OR_DEPARTMENT_OTHER): Payer: Self-pay | Admitting: Surgery

## 2022-04-09 DIAGNOSIS — E119 Type 2 diabetes mellitus without complications: Secondary | ICD-10-CM | POA: Diagnosis not present

## 2022-04-09 DIAGNOSIS — E785 Hyperlipidemia, unspecified: Secondary | ICD-10-CM | POA: Insufficient documentation

## 2022-04-09 DIAGNOSIS — K603 Anal fistula: Secondary | ICD-10-CM | POA: Diagnosis not present

## 2022-04-09 DIAGNOSIS — G4730 Sleep apnea, unspecified: Secondary | ICD-10-CM | POA: Diagnosis not present

## 2022-04-09 DIAGNOSIS — K6289 Other specified diseases of anus and rectum: Secondary | ICD-10-CM | POA: Diagnosis not present

## 2022-04-09 DIAGNOSIS — K601 Chronic anal fissure: Secondary | ICD-10-CM | POA: Insufficient documentation

## 2022-04-09 DIAGNOSIS — Z7984 Long term (current) use of oral hypoglycemic drugs: Secondary | ICD-10-CM | POA: Diagnosis not present

## 2022-04-09 DIAGNOSIS — I10 Essential (primary) hypertension: Secondary | ICD-10-CM | POA: Diagnosis not present

## 2022-04-09 DIAGNOSIS — I4891 Unspecified atrial fibrillation: Secondary | ICD-10-CM

## 2022-04-09 DIAGNOSIS — C773 Secondary and unspecified malignant neoplasm of axilla and upper limb lymph nodes: Secondary | ICD-10-CM | POA: Insufficient documentation

## 2022-04-09 DIAGNOSIS — Z01818 Encounter for other preprocedural examination: Secondary | ICD-10-CM

## 2022-04-09 HISTORY — DX: Anal fistula: K60.3

## 2022-04-09 HISTORY — DX: Personal history of other diseases of the respiratory system: Z87.09

## 2022-04-09 HISTORY — DX: Personal history of adenomatous and serrated colon polyps: Z86.0101

## 2022-04-09 HISTORY — DX: Unspecified hemorrhoids: K64.9

## 2022-04-09 HISTORY — PX: FISTULOTOMY: SHX6413

## 2022-04-09 HISTORY — DX: Presence of external hearing-aid: Z97.4

## 2022-04-09 HISTORY — DX: Nocturia: R35.1

## 2022-04-09 HISTORY — DX: Anal fistula, unspecified: K60.30

## 2022-04-09 HISTORY — DX: Abdominal aortic aneurysm, without rupture, unspecified: I71.40

## 2022-04-09 HISTORY — DX: Type 2 diabetes mellitus without complications: E11.9

## 2022-04-09 HISTORY — DX: Presence of spectacles and contact lenses: Z97.3

## 2022-04-09 HISTORY — DX: Personal history of colonic polyps: Z86.010

## 2022-04-09 HISTORY — DX: Other intervertebral disc degeneration, lumbar region without mention of lumbar back pain or lower extremity pain: M51.369

## 2022-04-09 HISTORY — PX: RECTAL EXAM UNDER ANESTHESIA: SHX6399

## 2022-04-09 HISTORY — DX: Other intervertebral disc degeneration, lumbar region: M51.36

## 2022-04-09 HISTORY — DX: Presence of other vascular implants and grafts: Z95.828

## 2022-04-09 HISTORY — DX: Personal history of other diseases of the circulatory system: Z86.79

## 2022-04-09 HISTORY — DX: Ventricular premature depolarization: I49.3

## 2022-04-09 LAB — CBC WITH DIFFERENTIAL/PLATELET
Abs Immature Granulocytes: 0.04 10*3/uL (ref 0.00–0.07)
Basophils Absolute: 0 10*3/uL (ref 0.0–0.1)
Basophils Relative: 0 %
Eosinophils Absolute: 0.6 10*3/uL — ABNORMAL HIGH (ref 0.0–0.5)
Eosinophils Relative: 6 %
HCT: 43 % (ref 39.0–52.0)
Hemoglobin: 14.8 g/dL (ref 13.0–17.0)
Immature Granulocytes: 0 %
Lymphocytes Relative: 23 %
Lymphs Abs: 2.3 10*3/uL (ref 0.7–4.0)
MCH: 29.1 pg (ref 26.0–34.0)
MCHC: 34.4 g/dL (ref 30.0–36.0)
MCV: 84.5 fL (ref 80.0–100.0)
Monocytes Absolute: 0.7 10*3/uL (ref 0.1–1.0)
Monocytes Relative: 7 %
Neutro Abs: 6.2 10*3/uL (ref 1.7–7.7)
Neutrophils Relative %: 64 %
Platelets: 205 10*3/uL (ref 150–400)
RBC: 5.09 MIL/uL (ref 4.22–5.81)
RDW: 12.7 % (ref 11.5–15.5)
WBC: 9.8 10*3/uL (ref 4.0–10.5)
nRBC: 0 % (ref 0.0–0.2)

## 2022-04-09 LAB — COMPREHENSIVE METABOLIC PANEL
ALT: 39 U/L (ref 0–44)
AST: 27 U/L (ref 15–41)
Albumin: 4.7 g/dL (ref 3.5–5.0)
Alkaline Phosphatase: 52 U/L (ref 38–126)
Anion gap: 9 (ref 5–15)
BUN: 28 mg/dL — ABNORMAL HIGH (ref 8–23)
CO2: 25 mmol/L (ref 22–32)
Calcium: 9.3 mg/dL (ref 8.9–10.3)
Chloride: 104 mmol/L (ref 98–111)
Creatinine, Ser: 1.82 mg/dL — ABNORMAL HIGH (ref 0.61–1.24)
GFR, Estimated: 40 mL/min — ABNORMAL LOW (ref >60)
Glucose, Bld: 136 mg/dL — ABNORMAL HIGH (ref 70–99)
Potassium: 3.6 mmol/L (ref 3.5–5.1)
Sodium: 138 mmol/L (ref 135–145)
Total Bilirubin: 2.4 mg/dL — ABNORMAL HIGH (ref 0.3–1.2)
Total Protein: 7.4 g/dL (ref 6.5–8.1)

## 2022-04-09 LAB — GLUCOSE, CAPILLARY
Glucose-Capillary: 142 mg/dL — ABNORMAL HIGH (ref 70–99)
Glucose-Capillary: 138 mg/dL — ABNORMAL HIGH (ref 70–99)

## 2022-04-09 SURGERY — FISTULOTOMY
Anesthesia: General | Site: Rectum

## 2022-04-09 MED ORDER — ROCURONIUM BROMIDE 10 MG/ML (PF) SYRINGE
PREFILLED_SYRINGE | INTRAVENOUS | Status: DC | PRN
  Administered 2022-04-09: 100 mg via INTRAVENOUS

## 2022-04-09 MED ORDER — SUGAMMADEX SODIUM 500 MG/5ML IV SOLN
INTRAVENOUS | Status: DC | PRN
  Administered 2022-04-09: 500 mg via INTRAVENOUS

## 2022-04-09 MED ORDER — ROCURONIUM BROMIDE 10 MG/ML (PF) SYRINGE
PREFILLED_SYRINGE | INTRAVENOUS | Status: AC
  Filled 2022-04-09: qty 10

## 2022-04-09 MED ORDER — DIBUCAINE (PERIANAL) 1 % EX OINT
TOPICAL_OINTMENT | CUTANEOUS | Status: DC | PRN
  Administered 2022-04-09: 1 via RECTAL

## 2022-04-09 MED ORDER — EPHEDRINE 5 MG/ML INJ
INTRAVENOUS | Status: AC
  Filled 2022-04-09: qty 5

## 2022-04-09 MED ORDER — MIDAZOLAM HCL 2 MG/2ML IJ SOLN
INTRAMUSCULAR | Status: DC | PRN
  Administered 2022-04-09: 2 mg via INTRAVENOUS

## 2022-04-09 MED ORDER — DEXAMETHASONE SODIUM PHOSPHATE 10 MG/ML IJ SOLN
INTRAMUSCULAR | Status: DC | PRN
  Administered 2022-04-09: 10 mg via INTRAVENOUS

## 2022-04-09 MED ORDER — MIDAZOLAM HCL 2 MG/2ML IJ SOLN
INTRAMUSCULAR | Status: AC
  Filled 2022-04-09: qty 2

## 2022-04-09 MED ORDER — FENTANYL CITRATE (PF) 100 MCG/2ML IJ SOLN
INTRAMUSCULAR | Status: AC
  Filled 2022-04-09: qty 2

## 2022-04-09 MED ORDER — ONDANSETRON HCL 4 MG/2ML IJ SOLN
INTRAMUSCULAR | Status: DC | PRN
  Administered 2022-04-09: 4 mg via INTRAVENOUS

## 2022-04-09 MED ORDER — FENTANYL CITRATE (PF) 100 MCG/2ML IJ SOLN: 25.0000 ug | INTRAMUSCULAR | Status: DC | PRN

## 2022-04-09 MED ORDER — BUPIVACAINE LIPOSOME 1.3 % IJ SUSP
INTRAMUSCULAR | Status: DC | PRN
  Administered 2022-04-09: 20 mL

## 2022-04-09 MED ORDER — BUPIVACAINE-EPINEPHRINE 0.25% -1:200000 IJ SOLN
INTRAMUSCULAR | Status: DC | PRN
Start: 2022-04-09 — End: 2022-04-09
  Administered 2022-04-09: 30 mL

## 2022-04-09 MED ORDER — LACTATED RINGERS IV SOLN: INTRAVENOUS | Status: DC

## 2022-04-09 MED ORDER — PHENYLEPHRINE 40 MCG/ML (10ML) SYRINGE FOR IV PUSH (FOR BLOOD PRESSURE SUPPORT)
PREFILLED_SYRINGE | INTRAVENOUS | Status: AC
  Filled 2022-04-09: qty 10

## 2022-04-09 MED ORDER — PROPOFOL 10 MG/ML IV BOLUS
INTRAVENOUS | Status: AC
  Filled 2022-04-09: qty 20

## 2022-04-09 MED ORDER — BUPIVACAINE LIPOSOME 1.3 % IJ SUSP: 20.0000 mL | Freq: Once | INTRAMUSCULAR | Status: DC

## 2022-04-09 MED ORDER — TRAMADOL HCL 50 MG PO TABS: 50.0000 mg | ORAL_TABLET | Freq: Four times a day (QID) | ORAL | 0 refills | Status: AC | PRN

## 2022-04-09 MED ORDER — ONDANSETRON HCL 4 MG/2ML IJ SOLN
INTRAMUSCULAR | Status: AC
  Filled 2022-04-09: qty 2

## 2022-04-09 MED ORDER — KETOROLAC TROMETHAMINE 30 MG/ML IJ SOLN
INTRAMUSCULAR | Status: AC
  Filled 2022-04-09: qty 1

## 2022-04-09 MED ORDER — LIDOCAINE HCL (PF) 2 % IJ SOLN
INTRAMUSCULAR | Status: AC
  Filled 2022-04-09: qty 5

## 2022-04-09 MED ORDER — FLEET ENEMA 7-19 GM/118ML RE ENEM
1.0000 | ENEMA | Freq: Once | RECTAL | Status: DC
Start: 2022-04-09 — End: 2022-04-09

## 2022-04-09 MED ORDER — PROPOFOL 10 MG/ML IV BOLUS
INTRAVENOUS | Status: DC | PRN
  Administered 2022-04-09: 150 mg via INTRAVENOUS

## 2022-04-09 MED ORDER — ACETAMINOPHEN 500 MG PO TABS
ORAL_TABLET | ORAL | Status: AC
  Filled 2022-04-09: qty 2

## 2022-04-09 MED ORDER — FLEET ENEMA 7-19 GM/118ML RE ENEM
1.0000 | ENEMA | Freq: Once | RECTAL | Status: DC
Start: 2022-04-10 — End: 2022-04-09

## 2022-04-09 MED ORDER — ACETAMINOPHEN 500 MG PO TABS
1000.0000 mg | ORAL_TABLET | ORAL | Status: AC
  Administered 2022-04-09: 1000 mg via ORAL

## 2022-04-09 MED ORDER — LIDOCAINE 2% (20 MG/ML) 5 ML SYRINGE
INTRAMUSCULAR | Status: DC | PRN
  Administered 2022-04-09: 100 mg via INTRAVENOUS

## 2022-04-09 MED ORDER — DEXAMETHASONE SODIUM PHOSPHATE 10 MG/ML IJ SOLN
INTRAMUSCULAR | Status: AC
  Filled 2022-04-09: qty 1

## 2022-04-09 MED ORDER — SUGAMMADEX SODIUM 500 MG/5ML IV SOLN
INTRAVENOUS | Status: AC
  Filled 2022-04-09: qty 5

## 2022-04-09 MED ORDER — FENTANYL CITRATE (PF) 100 MCG/2ML IJ SOLN
INTRAMUSCULAR | Status: DC | PRN
  Administered 2022-04-09 (×2): 50 ug via INTRAVENOUS

## 2022-04-09 MED ORDER — 0.9 % SODIUM CHLORIDE (POUR BTL) OPTIME
TOPICAL | Status: DC | PRN
  Administered 2022-04-09: 500 mL

## 2022-04-09 SURGICAL SUPPLY — 34 items
APL SKNCLS STERI-STRIP NONHPOA (GAUZE/BANDAGES/DRESSINGS) ×1
BENZOIN TINCTURE PRP APPL 2/3 (GAUZE/BANDAGES/DRESSINGS) ×2 IMPLANT
BLADE SURG 15 STRL LF DISP TIS (BLADE) ×1 IMPLANT
BLADE SURG 15 STRL SS (BLADE) ×2
COVER BACK TABLE 60X90IN (DRAPES) ×2 IMPLANT
COVER MAYO STAND STRL (DRAPES) ×2 IMPLANT
DECANTER SPIKE VIAL GLASS SM (MISCELLANEOUS) ×2 IMPLANT
DRAPE LAPAROTOMY 100X72 PEDS (DRAPES) ×2 IMPLANT
DRAPE UTILITY XL STRL (DRAPES) ×2 IMPLANT
DRSG PAD ABDOMINAL 8X10 ST (GAUZE/BANDAGES/DRESSINGS) ×2 IMPLANT
GAUZE 4X4 16PLY ~~LOC~~+RFID DBL (SPONGE) ×2 IMPLANT
GAUZE SPONGE 4X4 12PLY STRL (GAUZE/BANDAGES/DRESSINGS) ×1 IMPLANT
GLOVE BIO SURGEON STRL SZ7.5 (GLOVE) ×2 IMPLANT
GLOVE SRG 8 PF TXTR STRL LF DI (GLOVE) ×1 IMPLANT
GLOVE SURG NEOP MICRO LF SZ6.5 (GLOVE) ×2 IMPLANT
GLOVE SURG UNDER POLY LF SZ6.5 (GLOVE) ×1 IMPLANT
GLOVE SURG UNDER POLY LF SZ7 (GLOVE) ×1 IMPLANT
GLOVE SURG UNDER POLY LF SZ8 (GLOVE) ×2
GOWN STRL REUS W/ TWL LRG LVL3 (GOWN DISPOSABLE) IMPLANT
GOWN STRL REUS W/TWL LRG LVL3 (GOWN DISPOSABLE) ×4 IMPLANT
KIT TURNOVER CYSTO (KITS) ×2 IMPLANT
NEEDLE HYPO 22GX1.5 SAFETY (NEEDLE) ×2 IMPLANT
NS IRRIG 500ML POUR BTL (IV SOLUTION) ×2 IMPLANT
PACK BASIN DAY SURGERY FS (CUSTOM PROCEDURE TRAY) ×2 IMPLANT
PANTS MESH DISP LRG (UNDERPADS AND DIAPERS) ×1 IMPLANT
PANTS MESH DISPOSABLE L (UNDERPADS AND DIAPERS) ×1
PENCIL SMOKE EVACUATOR (MISCELLANEOUS) ×2 IMPLANT
SYR BULB IRRIG 60ML STRL (SYRINGE) ×2 IMPLANT
SYR CONTROL 10ML LL (SYRINGE) ×2 IMPLANT
SYR TB 1ML LL NO SAFETY (SYRINGE) IMPLANT
TOWEL OR 17X26 10 PK STRL BLUE (TOWEL DISPOSABLE) ×2 IMPLANT
TRAY DSU PREP LF (CUSTOM PROCEDURE TRAY) ×2 IMPLANT
TUBE CONNECTING 12X1/4 (SUCTIONS) ×2 IMPLANT
YANKAUER SUCT BULB TIP NO VENT (SUCTIONS) ×2 IMPLANT

## 2022-04-09 NOTE — H&P (Signed)
? ?CC: Here today for surgery ? ?HPI: ?Jesse Perez is an 71 y.o. male with history of DM, HTN, HLD, whom was seen in the office 03/15/22 as a referral by Dr. Marisue Humble for evaluation of possible hemorrhoids. ? ?Colonscopy with Dr. Carlean Purl 02/15/22 - 11 polyps removed. External hems, hypertrophic anal papilla, tags. Path showed TAs without dysplasia, hyperplastic polyp ? ?He reports an approximate 2-year history of itchy bottom as well as occasional bright red blood per rectum. He does report some external tags and has attributed his symptoms to this. He reports he takes Metamucil approximately once per month but generally has 2 soft bowel movements per day. He spends approximately 1 to 2 minutes on the commode. He drinks 64 ounces of water per day. He denies any significant tissue prolapse but does note external tags. He also reports interestingly a history of significant pain many years ago in the anal area followed by some drainage of purulent fluid. He has noticed the current symptoms he has since that time. ? 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 ? 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 ?PSH: Prostatectomy 2011 but no history of radiation. He denies any prior anorectal procedures or surgeries.  ? ?FHx: Multiple relatives with malignancies. His daughter recently passed away with stage IV breast cancer. 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 ? ?Social Hx: Denies use of tobacco/social EtOH use. Happily retired, previously was Clinical biochemist of HR for [employer] ? ?Past Medical History:  ?Diagnosis Date  ? AAA (abdominal aortic aneurysm) ([place])   ? followed by pcp;   last CTA in epic 06-17-2020 , 4.0cm  ? Anal fistula   ? Bradycardia   ? DDD (degenerative disc disease), lumbar   ? Family history of colon cancer - grandfather and CHEK  mutation increasing colon cancer risk 10/03/2015  ? GERD (gastroesophageal reflux disease)   ? Hemochromatosis carrier   ? Hemorrhoids   ? History of adenomatous polyp of colon   ? followed by dr Carlean Purl  ? History of asthma   ? child  ? History of atrial fibrillation   ? remote hx episdoe atrial fib  ? History of COVID-19 05/2021  ? per pt mild sympomts that resolved  ? History of lower GI bleeding 10/2015  ? post multiple colonscopy polypectomy's  ? History of prostate cancer 2011  ? urologist--- dr Diona Fanti;  primary prostate cancer;  06-30-2010  s/p radial prostatectomy w/ node dissection's '@[facility]' ,  no recurrence per pt  ? Hyperlipidemia   ? Hypertension   ? 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 oncologist--- dr Wanda Plump. collichio;  dx 43/ 3295;   08-01-2021  s/p WLE left lateral back w/ left axilla node dissection's (positive nodes), Stage III;  chemo started 10-02-2021 q28d  ? Nocturia   ? Nodule of left lung 2018  ? followed by oncology--- [facility]  ? Obstructive sleep apnea   ? does not use CPAP, intolerent;  mild osa per sleep study in epic 10-02-2011  ? Port-A-Cath in place   ? PVC's (premature ventricular contractions)   ? cardiologist-- dr g. taylor for remote hx atrial fib,  symptomatic PVCs bigeminy  (04-02-2022  per pt no longer is symptomatic with irregular heart beat)  ? Type 2 diabetes mellitus ([place])   ? followed by  pcp   (04-02-2022  per pt does not check blood sugar at home)  ? Wears contact lenses   ? Wears hearing aid in both ears   ? ? ?Past Surgical History:  ?Procedure Laterality Date  ? APPENDECTOMY    ? 1980s  ? COLONOSCOPY WITH PROPOFOL  02/15/2022  ? by dr Carlean Purl  ? COLONOSCOPY WITH PROPOFOL N/A 10/11/2015  ? Procedure: COLONOSCOPY WITH PROPOFOL;  Surgeon: Irene Shipper, MD;  Location: [facility];  Service: Endoscopy;  Laterality: N/A;  ? CYSTOSCOPY WITH URETHRAL DILATATION  11/13/2010  ? '@[facility]'  by dr Diona Fanti;   Dilatation urethral meatus and balloon dilatation bladder neck  ?  EXCISION MELANOMA WITH SENTINEL LYMPH NODE BIOPSY  08/01/2021  ? '@[facility]'  by dr Harriet Masson;   WLE left lateral back w/ left axilla node dissection's  ? LUMBAR LAMINECTOMY  02/27/2000  ? '@[facility]'  by dr Carloyn Manner;;  L3  and L4  ? ROBOT ASSISTED LAPAROSCOPIC RADICAL PROSTATECTOMY  06/30/2010  ? '@[facility]'   ? ? ?Family History  ?Problem Relation Age of Onset  ? Breast cancer Mother 20  ? Cancer Mother   ? Hypertension Father   ? Stroke Father   ? Breast cancer Sister 101  ?     BRCA neg  ? Colon polyps Sister   ? Breast cancer Maternal Grandmother   ?     dx in her 61s  ? Breast cancer Daughter 24  ?     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 Hemochromatosis Daughter 63  ?     C282Y and S65C double heterozygote  ? Colon cancer Paternal Grandfather   ? Crohn's disease Neg Hx   ? Liver cancer Neg Hx   ? Pancreatic cancer Neg Hx   ? Rectal cancer Neg Hx   ? Stomach cancer Neg Hx   ? Ulcerative colitis Neg Hx   ? Esophageal cancer Neg Hx   ? ? ?Social:  reports that he quit smoking about 24 years ago. His smoking use included cigarettes. He has never used smokeless tobacco. He reports current alcohol use. He reports that he does not use drugs. ? ?Allergies:  ?Allergies  ?Allergen Reactions  ? Penicillins Anaphylaxis  ?  Has patient had a PCN reaction causing immediate rash, facial/tongue/throat swelling, SOB or lightheadedness with hypotension: Yes ?Has patient had a PCN reaction causing severe rash involving mucus membranes or skin necrosis: No ?Has patient had a PCN reaction that required hospitalization Yes ?Has patient had a PCN reaction occurring within the last 10 years: No ?If all of the above answers are "NO", then may proceed with Cephalosporin use. ? ?  ? Zolpidem Tartrate Anaphylaxis  ? Metoprolol   ?  Other reaction(s): fatigue (2021)  ? ? ?Medications: I have reviewed the patient's current medications. ? ?Results for orders placed or performed during the hospital encounter of 04/09/22 (from the past 48 hour(s))  ?CBC WITH DIFFERENTIAL      Status: Abnormal  ? Collection Time: 04/09/22  8:12 AM  ?Result Value Ref Range  ? WBC 9.8 4.0 - 10.5 K/uL  ? RBC 5.09 4.22 - 5.81 MIL/uL  ? Hemoglobin 14.8 13.0 - 17.0 g/dL  ? HCT 43.0 39.0 - 52.0 %  ? MCV 84.5 80.0 - 100.0 fL  ? MCH 29.1 26.0 - 34.0 pg  ? MCHC 34.4 30.0 - 36.0 g/dL  ? RDW 12.7 11.5 - 15.5 %  ? Platelets 205 150 - 400 K/uL  ? nRBC 0.0 0.0 - 0.2 %  ?  Neutrophils Relative % 64 %  ? Neutro Abs 6.2 1.7 - 7.7 K/uL  ? Lymphocytes Relative 23 %  ? Lymphs Abs 2.3 0.7 - 4.0 K/uL  ? Monocytes Relative 7 %  ? Monocytes Absolute 0.7 0.1 - 1.0 K/uL  ? Eosinophils Relative 6 %  ? Eosinophils Absolute 0.6 (H) 0.0 - 0.5 K/uL  ? Basophils Relative 0 %  ? Basophils Absolute 0.0 0.0 - 0.1 K/uL  ? Immature Granulocytes 0 %  ? Abs Immature Granulocytes 0.04 0.00 - 0.07 K/uL  ?  Comment: Performed at [facility], [place] [address], [place], [place] [address]  ?Glucose, capillary     Status: Abnormal  ? Collection Time: 04/09/22  8:19 AM  ?Result Value Ref Range  ? Glucose-Capillary 142 (H) 70 - 99 mg/dL  ?  Comment: Glucose reference range applies only to samples taken after fasting for at least 8 hours.  ? ? ?No results found. ? ?ROS - all of the below systems have been reviewed with the patient and positives are indicated with bold text ?General: chills, fever or night sweats ?Eyes: blurry vision or double vision ?ENT: epistaxis or sore throat ?Allergy/Immunology: itchy/watery eyes or nasal congestion ?Hematologic/Lymphatic: bleeding problems, blood clots or swollen lymph nodes ?Endocrine: temperature intolerance or unexpected weight changes ?Breast: new or changing breast lumps or nipple discharge ?Resp: cough, shortness of breath, or wheezing ?CV: chest pain or dyspnea on exertion ?GI: as per HPI ?GU: dysuria, trouble voiding, or hematuria ?MSK: joint pain or joint stiffness ?Neuro: TIA or stroke symptoms ?Derm: pruritus and skin lesion changes ?Psych: anxiety and depression ? ?PE ?Blood pressure  133/73, pulse 82, temperature 98 ?F (36.7 ?C), temperature source Oral, resp. rate 20, height '5\' 9"'  (1.753 m), weight 91.9 kg, SpO2 96 %. ?Constitutional: NAD; conversant ?Eyes: Moist conjunctiva; no lid lag ?Neck

## 2022-04-09 NOTE — Discharge Instructions (Addendum)
ANORECTAL SURGERY: POST OP INSTRUCTIONS ? 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 Generally anal fissures are painful and similar to a deep papercut in the anal canal. Keeping you stools soft long term with fiber (metamucil, benefiber, etc) daily is a good way to help this heal. Additionally, drink 64 oz of water per day. Minimize time on commode to 2-3 minutes and avoid spreading the buttocks when sitting on the commode. ? ?DIET: Follow a light bland diet the first 24 hours after arrival home, such as soup, liquids, crackers, etc.  Be sure to include lots of fluids daily.  Avoid fast food or heavy meals as your are more likely to get nauseated.  Eat a low fat diet the next few days after surgery.   ?Some bleeding with bowel movements is expected for the first couple of days but this should stop in between bowel movements ? ?Take your usually prescribed home medications unless otherwise directed. ? ?PAIN CONTROL: ?It is helpful to take an over-the-counter pain medication regularly for the first few days/weeks.  Choose from the following that works best for you: ?Ibuprofen (Advil, etc) Three 200mg  tabs every 6 hours as needed. ?Acetaminophen (Tylenol, etc) 500-650mg  every 6 hours as needed ?NOTE: You may take both of these medications together - most patients find it most helpful when alternating between the two (i.e. Ibuprofen at 6am, tylenol at 9am, ibuprofen at 12pm ...) ?A  prescription for pain medication may have been prescribed for you at discharge.  Take your pain medication as prescribed.  ?If you are having problems/concerns with the prescription medicine, please call us for further advice. ? ?Avoid getting constipated.  Between the surgery and the pain medications, it is common to experience some constipation.  Increasing fluid intake (64oz of water per day) and taking a fiber supplement (such as Metamucil, Citrucel, FiberCon) 1-2 times a day  regularly will usually help prevent this problem from occurring.  Take Miralax (over the counter) 1-2x/day while taking a narcotic pain medication. If no bowel movement after 48hours, you may additionally take a laxative like a bottle of Milk of Magnesia which can be purchased over the counter. Avoid enemas if possible as these are often painful. ?  ?Watch out for diarrhea.  If you have many loose bowel movements, simplify your diet to bland foods.  Stop any stool softeners and decrease your fiber supplement. If this worsens or does not improve, please call us. ? ?Wash / shower every day.  If you were discharged with a dressing, you may remove this the day after your surgery. You may shower normally, getting soap/water on your wound, particularly after bowel movements. ? ?Soaking in a warm bath filled a couple inches ("Sitz bath") is a great way to clean the area after a bowel movement and many patients find it is a way to soothe the area. ? ?ACTIVITIES as tolerated:   ?You may resume regular (light) daily activities beginning the next day--such as daily self-care, walking, climbing stairs--gradually increasing activities as tolerated.  If you can walk 30 minutes without difficulty, it is safe to try more intense activity such as jogging, treadmill, bicycling, low-impact aerobics, etc. ?Refrain from any heavy lifting or straining for the first 2 weeks after your procedure, particularly if your surgery was for hemorrhoids. ?Avoid activities that make your pain worse ?You may drive when you are no longer taking prescription pain medication, you can comfortably wear a  seatbelt, and you can safely maneuver your car and apply brakes. ? ?FOLLOW UP in our office ?Please call [facility] at [phone] to set up an appointment to see your surgeon in the office for a follow-up appointment approximately 2 weeks after your surgery. ?Make sure that you call for this appointment the day you arrive home to insure a convenient  appointment time. ? ?9. If you have disability or family leave forms that need to be completed, you may have them completed by your primary care physician's office; for return to work instructions, please ask our office staff and they will be happy to assist you in obtaining this documentation ?  ?When to call us [phone]: ?Poor pain control ?Reactions / problems with new medications (rash/itching, etc)  ?Fever over 101.5 F (38.5 C) ?Inability to urinate ?Nausea/vomiting ?Worsening swelling or bruising ?Continued bleeding from incision. ?Increased pain, redness, or drainage from the incision ? ?The clinic staff is available to answer your questions during regular business hours (8:30am-5pm).  Please don?t hesitate to call and ask to speak to one of our nurses for clinical concerns.   A surgeon from [facility] is always on call at the hospitals ?  ?If you have a medical emergency, go to the nearest emergency room or call 911. ?  ?[facility] ?[facility] ?[address] [place], [place], [place]  [address] ?MAIN: [phone] ?FAX: [phone] ?[email or web address] ?Post Anesthesia Home Care Instructions ? ?Activity: ?Get plenty of rest for the remainder of the day. A responsible adult should stay with you for 24 hours following the procedure.  ?For the next 24 hours, DO NOT: ?-Drive a car ?-Paediatric nurse ?-Drink alcoholic beverages ?-Take any medication unless instructed by your physician ?-Make any legal decisions or sign important papers. ? ?Meals: ?Start with liquid foods such as gelatin or soup. Progress to regular foods as tolerated. Avoid greasy, spicy, heavy foods. If nausea and/or vomiting occur, drink only clear liquids until the nausea and/or vomiting subsides. Call your physician if vomiting continues. ? ?Special Instructions/Symptoms: ?Your throat may feel dry or sore from the anesthesia or the breathing tube placed in your throat  during surgery. If this causes discomfort, gargle with warm salt water. The discomfort should disappear within 24 hours. ? ? ?  Information for Discharge Teaching: ?EXPAREL (bupivacaine liposome injectable suspension)  ? ?Your surgeon gave you EXPAREL(bupivacaine) in your surgical incision to help control your pain after surgery.  ?EXPAREL is a local anesthetic that provides pain relief by numbing the tissue around the surgical site. ?EXPAREL is designed to release pain medication over time and can control pain for up to 72 hours. ?Depending on how you respond to EXPAREL, you may require less pain medication during your recovery. ? ?Possible side effects: ?Temporary loss of sensation or ability to move in the area where bupivacaine was injected. ?Nausea, vomiting, constipation ?Rarely, numbness and tingling in your mouth or lips, lightheadedness, or anxiety may occur. ?Call your doctor right away if you think you may be experiencing any of these sensations, or if you have other questions regarding possible side effects. ? ?Follow all other discharge instructions given to you by your surgeon or nurse. Eat a healthy diet and drink plenty of water or other fluids. ? ?If you return to the hospital for any reason within 96 hours following the administration of EXPAREL, please inform your health care providers.  ?

## 2022-04-09 NOTE — Op Note (Signed)
04/09/2022 ? ?9:37 AM ? ?PATIENT:  Jesse Perez  71 y.o. male ? ?Patient Care Team: ?Gaynelle Arabian, MD as PCP - General (Family Medicine) ? ?PRE-OPERATIVE DIAGNOSIS: Anal fistula ? ?POST-OPERATIVE DIAGNOSIS:   ?Chronic anal fissure, anterior midline ?Anal fistula - subcutaneous ? ?PROCEDURE:   ?Anal fistulotomy, anterior midline ?Anal tissue biopsy - anterior midline ?Anorectal exam under anesthesia ? ?SURGEON:  Surgeon(s): ?Ileana Roup, MD ? ?ANESTHESIA:   local and general ? ?SPECIMEN:  Anterior midline anal tissue ? ?DISPOSITION OF SPECIMEN:  PATHOLOGY ? ?COUNTS:  Sponge, needle, and instrument counts were reported correct x2 at conclusion. ? ?EBL: 2 mL ? ?Drains: None ? ?PLAN OF CARE: Discharge to home after PACU ? ?PATIENT DISPOSITION:  PACU - hemodynamically stable. ? ?OR FINDINGS: Anterior midline anal fissure which extends up to the dentate, exposed sphincter muscle at base. Anal fistula in the anterior midline associated with the fissure - this is ~subcutaneous in nature and doesn't involve any significant sphincter muscle. This was amenable to fistulotomy. Tissue from around the fissure including margins of it excised which incorporates some of his tags and submitted as pathology. ? ?DESCRIPTION: ?The patient was identified in the preoperative holding area and taken to the OR. SCDs were applied. He then underwent general endotracheal anesthesia without difficulty. He was then rolled onto the OR table in the prone jackknife position. Pressure points were then evaluated and padded. Benzoin was applied to the buttocks and they were gently taped apart.  He was then prepped and draped in usual sterile fashion.  A surgical timeout was performed indicating the correct patient, procedure, and positioning.  A perianal block was then created using a dilute mixture of 0.25% Marcaine with epinephrine and Exparel. ? ?After ascertaining an appropriate level of anesthesia had been achieved, a well  lubricated digital rectal exam was performed.  There are no palpable masses but a presumed fissure in the anterior midline which is rather deep.  A Hill-Ferguson anoscope was into the anal canal and circumferential inspection demonstrated healthy appearing anoderm.  Within the anterior midline there is a deep anal fissure that extends up to the level of the dentate line.  There is exposed sphincter muscle.  Externally, he has a evident anal fistula with an external opening in the anterior midline.  This appears to be associated with his chronic anal fissure.  There are some associated tags.  The fistula is carefully probed and found to be primarily subcutaneous in nature.  There is no significant sphincter muscle involvement.  This is short tract approximately 1 cm in length.  This appears amenable to a primary fistulotomy.  Skin is incised sharply and the subcutaneous tissue divided with electrocautery.  No sphincter muscle was divided.  The chronic granulation tissue within this tract was then fulgurated.  Biopsy of the chronic anal wound (fissure) is carried out incorporating his anterior midline tags with this.  These were submitted for specimen/pathology.  The anal canal was then irrigated and hemostasis verified.  Additional local anesthetic is infiltrated.  All counts are reported correct.  Topical Dibucaine is applied. ? ?The buttocks were then untaped.  A dressing consisting of 4 x 4's, ABD, mesh underwear was placed.  He was then rolled back onto a stretcher, awakened from anesthesia, extubated, and transferred to a stretcher for transport to recovery in satisfactory condition. ? ?DISPOSITION: PACU in satisfactory condition. ?

## 2022-04-09 NOTE — Transfer of Care (Signed)
Immediate Anesthesia Transfer of Care Note ? ?Patient: Jesse Perez ? ?Procedure(s) Performed: Procedure(s) (LRB): ?FISTULOTOMY, ANAL BIOPSY (N/A) ?ANORECTAL EXAM UNDER ANESTHESIA (N/A) ? ?Patient Location: PACU ? ?Anesthesia Type: General ? ?Level of Consciousness: awake, alert  and oriented ? ?Airway & Oxygen Therapy: Patient Spontanous Breathing  On room air. ? ?Post-op Assessment: Report given to PACU RN and Post -op Vital signs reviewed and stable ? ?Post vital signs: Reviewed and stable ? ?Complications: No apparent anesthesia complications ?Last Vitals:  ?Vitals Value Taken Time  ?BP 132/76 04/09/22 1000  ?Temp 36.5 ?C 04/09/22 0947  ?Pulse 82 04/09/22 1001  ?Resp 15 04/09/22 0958  ?SpO2 95 % 04/09/22 1001  ?Vitals shown include unvalidated device data. ? ?Last Pain:  ?Vitals:  ? 04/09/22 0947  ?TempSrc: Oral  ?PainSc:   ?   ? ?Patients Stated Pain Goal: 4 (04/09/22 0801) ? ?Complications: No notable events documented. ?

## 2022-04-09 NOTE — Anesthesia Procedure Notes (Signed)
Procedure Name: Intubation ?Date/Time: 04/09/2022 9:04 AM ?Performed by: Mechele Claude, CRNA ?Pre-anesthesia Checklist: Patient identified, Emergency Drugs available, Suction available and Patient being monitored ?Patient Re-evaluated:Patient Re-evaluated prior to induction ?Oxygen Delivery Method: Circle system utilized ?Preoxygenation: Pre-oxygenation with 100% oxygen ?Induction Type: IV induction ?Ventilation: Mask ventilation without difficulty ?Laryngoscope Size: Mac and 4 ?Grade View: Grade II ?Tube type: Oral ?Tube size: 7.5 mm ?Number of attempts: 1 ?Airway Equipment and Method: Stylet and Oral airway ?Placement Confirmation: ETT inserted through vocal cords under direct vision, positive ETCO2 and breath sounds checked- equal and bilateral ?Secured at: 22 cm ?Tube secured with: Tape ?Dental Injury: Teeth and Oropharynx as per pre-operative assessment  ? ? ? ? ?

## 2022-04-09 NOTE — Anesthesia Preprocedure Evaluation (Addendum)
Anesthesia Evaluation  ?Patient identified by MRN, date of birth, ID band ?Patient awake ? ? ? ?Reviewed: ?Allergy & Precautions, NPO status , Patient's Chart, lab work & pertinent test results ? ?Airway ?Mallampati: III ? ?TM Distance: >3 FB ?Neck ROM: Full ? ?Mouth opening: Limited Mouth Opening ? Dental ? ?(+) Teeth Intact, Dental Advisory Given ?  ?Pulmonary ?asthma , sleep apnea (does not use CPAP) , former smoker,  ?  ?Pulmonary exam normal ?breath sounds clear to auscultation ? ? ? ? ? ? Cardiovascular ?hypertension, Pt. on medications ?Normal cardiovascular exam+ dysrhythmias Atrial Fibrillation  ?Rhythm:Regular Rate:Normal ? ?AAA 4.0cm ?  ?Neuro/Psych ?negative neurological ROS ? negative psych ROS  ? GI/Hepatic ?Neg liver ROS, GERD  ,  ?Endo/Other  ?diabetes, Type 2, Oral Hypoglycemic Agents ? Renal/GU ?negative Renal ROS  ?negative genitourinary ?  ?Musculoskeletal ?negative musculoskeletal ROS ?(+)  ? Abdominal ?  ?Peds ? Hematology ?negative hematology ROS ?(+)   ?Anesthesia Other Findings ? ? Reproductive/Obstetrics ? ?  ? ? ? ? ? ? ? ? ? ? ? ? ? ?  ?  ? ? ? ? ? ? ? ?Anesthesia Physical ?Anesthesia Plan ? ?ASA: 3 ? ?Anesthesia Plan: General  ? ?Post-op Pain Management:   ? ?Induction: Intravenous ? ?PONV Risk Score and Plan: 2 and Midazolam, Dexamethasone and Ondansetron ? ?Airway Management Planned: Oral ETT ? ?Additional Equipment:  ? ?Intra-op Plan:  ? ?Post-operative Plan: Extubation in OR ? ?Informed Consent: I have reviewed the patients History and Physical, chart, labs and discussed the procedure including the risks, benefits and alternatives for the proposed anesthesia with the patient or authorized representative who has indicated his/her understanding and acceptance.  ? ? ? ?Dental advisory given ? ?Plan Discussed with: CRNA ? ?Anesthesia Plan Comments:   ? ? ? ? ? ? ?Anesthesia Quick Evaluation ? ?

## 2022-04-09 NOTE — Anesthesia Postprocedure Evaluation (Signed)
Anesthesia Post Note ? ?Patient: MARTIE FULGHAM ? ?Procedure(s) Performed: FISTULOTOMY, ANAL BIOPSY ([place]) ?ANORECTAL EXAM UNDER ANESTHESIA ([place]) ? ?  ? ?Patient location during evaluation: PACU ?Anesthesia Type: General ?Level of consciousness: awake and alert ?Pain management: pain level controlled ?Vital Signs Assessment: post-procedure vital signs reviewed and stable ?Respiratory status: spontaneous breathing, nonlabored ventilation, respiratory function stable and patient connected to nasal cannula oxygen ?Cardiovascular status: blood pressure returned to baseline and stable ?Postop Assessment: no apparent nausea or vomiting ?Anesthetic complications: no ? ? ?No notable events documented. ? ?Last Vitals:  ?Vitals:  ? 04/09/22 1035 04/09/22 1037  ?BP:    ?Pulse: 76 72  ?Resp: (!) 21 13  ?Temp:  36.5 ?C  ?SpO2: 92% 98%  ?  ?Last Pain:  ?Vitals:  ? 04/09/22 1037  ?TempSrc: Oral  ?PainSc:   ? ? ?  ?  ?  ?  ?  ?  ? ?Kenyonna Micek L Markice Torbert ? ? ? ? ?

## 2022-04-10 ENCOUNTER — Encounter (HOSPITAL_BASED_OUTPATIENT_CLINIC_OR_DEPARTMENT_OTHER): Payer: Self-pay | Admitting: Surgery

## 2022-04-10 LAB — SURGICAL PATHOLOGY

## 2022-04-26 DIAGNOSIS — C439 Malignant melanoma of skin, unspecified: Secondary | ICD-10-CM | POA: Diagnosis not present

## 2022-04-26 DIAGNOSIS — Z79899 Other long term (current) drug therapy: Secondary | ICD-10-CM | POA: Diagnosis not present

## 2022-05-01 DIAGNOSIS — L430 Hypertrophic lichen planus: Secondary | ICD-10-CM | POA: Diagnosis not present

## 2022-05-01 DIAGNOSIS — D2272 Melanocytic nevi of left lower limb, including hip: Secondary | ICD-10-CM | POA: Diagnosis not present

## 2022-05-01 DIAGNOSIS — Z85820 Personal history of malignant melanoma of skin: Secondary | ICD-10-CM | POA: Diagnosis not present

## 2022-05-01 DIAGNOSIS — D225 Melanocytic nevi of trunk: Secondary | ICD-10-CM | POA: Diagnosis not present

## 2022-05-01 DIAGNOSIS — D485 Neoplasm of uncertain behavior of skin: Secondary | ICD-10-CM | POA: Diagnosis not present

## 2022-05-01 DIAGNOSIS — Z85828 Personal history of other malignant neoplasm of skin: Secondary | ICD-10-CM | POA: Diagnosis not present

## 2022-05-02 DIAGNOSIS — E1169 Type 2 diabetes mellitus with other specified complication: Secondary | ICD-10-CM | POA: Diagnosis not present

## 2022-06-07 DIAGNOSIS — R918 Other nonspecific abnormal finding of lung field: Secondary | ICD-10-CM | POA: Diagnosis not present

## 2022-06-07 DIAGNOSIS — Z87891 Personal history of nicotine dependence: Secondary | ICD-10-CM | POA: Diagnosis not present

## 2022-06-07 DIAGNOSIS — Z79899 Other long term (current) drug therapy: Secondary | ICD-10-CM | POA: Diagnosis not present

## 2022-06-07 DIAGNOSIS — C439 Malignant melanoma of skin, unspecified: Secondary | ICD-10-CM | POA: Diagnosis not present

## 2022-06-07 DIAGNOSIS — R911 Solitary pulmonary nodule: Secondary | ICD-10-CM | POA: Diagnosis not present

## 2022-06-07 NOTE — Progress Notes (Signed)
PCP:  Gaynelle Arabian, MD Primary Cardiologist: None Electrophysiologist: Cristopher Peru, MD   Jesse Perez is a 71 y.o. male seen today for Cristopher Peru, MD for routine electrophysiology followup.  Since last being seen in our clinic the patient reports doing very well. Had CT screenings yesterday for melanoma surveillance.  he denies chest pain, palpitations, dyspnea, PND, orthopnea, nausea, vomiting, dizziness, syncope, edema, weight gain, or early satiety.  Past Medical History:  Diagnosis Date   AAA (abdominal aortic aneurysm) ([place])    followed by pcp;   last CTA in epic 06-17-2020 , 4.0cm   Anal fistula    Bradycardia    DDD (degenerative disc disease), lumbar    Family history of colon cancer - grandfather and CHEK mutation increasing colon cancer risk 10/03/2015   GERD (gastroesophageal reflux disease)    Hemochromatosis carrier    Hemorrhoids    History of adenomatous polyp of colon    followed by dr Carlean Purl   History of asthma    child   History of atrial fibrillation    remote hx episdoe atrial fib   History of COVID-19 05/2021   per pt mild sympomts that resolved   History of lower GI bleeding 10/2015   post multiple colonscopy polypectomy's   History of prostate cancer 2011   urologist--- dr Diona Fanti;  primary prostate cancer;  06-30-2010  s/p radial prostatectomy w/ node dissection's @[facility] ,  no recurrence per pt   Hyperlipidemia    Hypertension    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 collichio;  dx 81/ 8299;   08-01-2021  s/p WLE left lateral back w/ left axilla node dissection's (positive nodes), Stage III;  chemo started 10-02-2021 q28d   Nocturia    Nodule of left lung 2018   followed by oncology--- [facility]   Obstructive sleep apnea    does not use CPAP, intolerent;  mild osa per sleep study in epic 10-02-2011   Port-A-Cath in place    PVC's (premature ventricular contractions)    cardiologist-- dr g. taylor for  remote hx atrial fib,  symptomatic PVCs bigeminy  (04-02-2022  per pt no longer is symptomatic with irregular heart beat)   Type 2 diabetes mellitus ([place])    followed by pcp   (04-02-2022  per pt does not check blood sugar at home)   Wears contact lenses    Wears hearing aid in both ears    Past Surgical History:  Procedure Laterality Date   APPENDECTOMY     1980s   COLONOSCOPY WITH PROPOFOL  02/15/2022   by dr Carlean Purl   COLONOSCOPY WITH PROPOFOL N/A 10/11/2015   Procedure: COLONOSCOPY WITH PROPOFOL;  Surgeon: Irene Shipper, MD;  Location: [facility];  Service: Endoscopy;  Laterality: N/A;   CYSTOSCOPY WITH URETHRAL DILATATION  11/13/2010   @[facility]  by dr Diona Fanti;   Dilatation urethral meatus and balloon dilatation bladder neck   EXCISION MELANOMA WITH SENTINEL LYMPH NODE BIOPSY  08/01/2021   @[facility]  by dr Harriet Masson;   WLE left lateral back w/ left axilla node dissection's   FISTULOTOMY N/A 04/09/2022   Procedure: FISTULOTOMY, ANAL BIOPSY;  Surgeon: Ileana Roup, MD;  Location: [place];  Service: General;  Laterality: N/A;   LUMBAR LAMINECTOMY  02/27/2000   @[facility]  by dr Carloyn Manner;;  L3  and L4   RECTAL EXAM UNDER ANESTHESIA N/A 04/09/2022   Procedure: ANORECTAL EXAM UNDER ANESTHESIA;  Surgeon: Ileana Roup, MD;  Location:  [place];  Service: General;  Laterality: N/A;   ROBOT ASSISTED LAPAROSCOPIC RADICAL PROSTATECTOMY  06/30/2010   @[facility]     Current Outpatient Medications  Medication Sig Dispense Refill   albuterol (VENTOLIN HFA) 108 (90 Base) MCG/ACT inhaler 1-2 puffs every 6 (six) hours as needed for wheezing.     fluticasone (FLONASE) 50 MCG/ACT nasal spray 2 sprays at bedtime as needed.     gabapentin (NEURONTIN) 100 MG capsule Take 100 mg by mouth at bedtime.     ibuprofen (ADVIL) 200 MG tablet Take 400 mg by mouth every 6 (six) hours as needed.     loratadine (CLARITIN) 10 MG tablet Take 10 mg by mouth daily as needed for allergies.       losartan-hydrochlorothiazide (HYZAAR) 100-12.5 MG tablet Take 1 tablet by mouth daily.     metFORMIN (GLUCOPHAGE-XR) 500 MG 24 hr tablet Take 1,000 mg by mouth daily.     Naproxen Sodium 220 MG CAPS Take 1 capsule by mouth 2 (two) times daily as needed.     omeprazole (PRILOSEC) 40 MG capsule Take 1 capsule by mouth 2 (two) times daily.     OPDIVO 120 MG/12ML SOLN Inject into the vein. Once a month at cancer center in [place]     rosuvastatin (CRESTOR) 5 MG tablet Take 5 mg by mouth daily.     Semaglutide (OZEMPIC, 0.25 OR 0.5 MG/DOSE, [place]) Inject into the skin once a week. Monday's     desonide (DESOWEN) 0.05 % lotion Apply topically as directed. Per pt as needed (Patient not taking: Reported on 06/08/2022)     No current facility-administered medications for this visit.    Allergies  Allergen Reactions   Penicillins Anaphylaxis    Has patient had a PCN reaction causing immediate rash, facial/tongue/throat swelling, SOB or lightheadedness with hypotension: Yes Has patient had a PCN reaction causing severe rash involving mucus membranes or skin necrosis: No Has patient had a PCN reaction that required hospitalization Yes Has patient had a PCN reaction occurring within the last 10 years: No If all of the above answers are "NO", then may proceed with Cephalosporin use.     Zolpidem Tartrate Anaphylaxis   Metoprolol     Other reaction(s): fatigue (2021)    Social History   Socioeconomic History   Marital status: Married    Spouse name: Not on file   Number of children: 3   Years of education: Not on file   Highest education level: Not on file  Occupational History    Employer: [employer]  Tobacco Use   Smoking status: Former    Years: 27.00    Types: Cigarettes    Quit date: 1999    Years since quitting: 24.4   Smokeless tobacco: Never  Vaping Use   Vaping Use: Never used  Substance and Sexual Activity   Alcohol use: Yes    Comment: occasional   Drug use: Never    Sexual activity: Not on file  Other Topics Concern   Not on file  Social History Narrative   Not on file   Social Determinants of Health   Financial Resource Strain: Not on file  Food Insecurity: Not on file  Transportation Needs: Not on file  Physical Activity: Not on file  Stress: Not on file  Social Connections: Not on file  Intimate Partner Violence: Not on file     Review of Systems: All other systems reviewed and are otherwise negative except as noted above.  Physical Exam: Vitals:   06/08/22 0821  BP: 120/76  Pulse: 68  SpO2: 97%  Weight: 191 lb (86.6 kg)  Height: 5\' 9"  (1.753 m)    GEN- The patient is well appearing, alert and oriented x 3 today.   HEENT: normocephalic, atraumatic; sclera clear, conjunctiva pink; hearing intact; oropharynx clear; neck supple, no JVP Lymph- no cervical lymphadenopathy Lungs- Clear to ausculation bilaterally, normal work of breathing.  No wheezes, rales, rhonchi Heart- Regular rate and rhythm, no murmurs, rubs or gallops, PMI not laterally displaced GI- soft, non-tender, non-distended, bowel sounds present, no hepatosplenomegaly Extremities- no clubbing, cyanosis, or edema; DP/PT/radial pulses 2+ bilaterally MS- no significant deformity or atrophy Skin- warm and dry, no rash or lesion Psych- euthymic mood, full affect Neuro- strength and sensation are intact  EKG is ordered. Personal review of EKG from today shows NSR at 68 bpm  Additional studies reviewed include: Previous EP office notes.   Assessment and Plan:  1. PVCs Overall controlled without recurrence off of BB.   2. HTN Stable on current regimen   3. Thoracic aortic aneurysm surveillance He will ask PCP about updating this.   Follow up with  EP as needed.  He has not had palpitations in many years. He will call if he has any recurrence.   Shirley Friar, PA-C  06/08/22 8:29 AM

## 2022-06-08 ENCOUNTER — Encounter: Payer: Self-pay | Admitting: Student

## 2022-06-08 ENCOUNTER — Ambulatory Visit: Payer: Medicare HMO | Admitting: Student

## 2022-06-08 VITALS — BP 120/76 | HR 68 | Ht 69.0 in | Wt 191.0 lb

## 2022-06-08 DIAGNOSIS — I493 Ventricular premature depolarization: Secondary | ICD-10-CM

## 2022-06-08 DIAGNOSIS — I10 Essential (primary) hypertension: Secondary | ICD-10-CM

## 2022-06-18 DIAGNOSIS — L988 Other specified disorders of the skin and subcutaneous tissue: Secondary | ICD-10-CM | POA: Diagnosis not present

## 2022-06-18 DIAGNOSIS — D485 Neoplasm of uncertain behavior of skin: Secondary | ICD-10-CM | POA: Diagnosis not present

## 2022-06-18 DIAGNOSIS — Z85820 Personal history of malignant melanoma of skin: Secondary | ICD-10-CM | POA: Diagnosis not present

## 2022-06-28 ENCOUNTER — Other Ambulatory Visit: Payer: Self-pay | Admitting: Nephrology

## 2022-06-28 ENCOUNTER — Ambulatory Visit
Admission: RE | Admit: 2022-06-28 | Discharge: 2022-06-28 | Disposition: A | Discharge status: Home / Self Care | Payer: Medicare HMO | Source: Ambulatory Visit | Attending: Nephrology | Admitting: Nephrology

## 2022-06-28 DIAGNOSIS — N179 Acute kidney failure, unspecified: Secondary | ICD-10-CM

## 2022-06-28 DIAGNOSIS — E1122 Type 2 diabetes mellitus with diabetic chronic kidney disease: Secondary | ICD-10-CM | POA: Diagnosis not present

## 2022-06-28 DIAGNOSIS — N182 Chronic kidney disease, stage 2 (mild): Secondary | ICD-10-CM | POA: Diagnosis not present

## 2022-06-28 DIAGNOSIS — N2889 Other specified disorders of kidney and ureter: Secondary | ICD-10-CM | POA: Diagnosis not present

## 2022-06-28 DIAGNOSIS — C439 Malignant melanoma of skin, unspecified: Secondary | ICD-10-CM | POA: Diagnosis not present

## 2022-06-28 DIAGNOSIS — Z8546 Personal history of malignant neoplasm of prostate: Secondary | ICD-10-CM | POA: Diagnosis not present

## 2022-06-28 DIAGNOSIS — Z8719 Personal history of other diseases of the digestive system: Secondary | ICD-10-CM | POA: Diagnosis not present

## 2022-06-29 ENCOUNTER — Other Ambulatory Visit (HOSPITAL_COMMUNITY): Payer: Self-pay | Admitting: Nephrology

## 2022-06-29 ENCOUNTER — Other Ambulatory Visit: Payer: Self-pay | Admitting: Nephrology

## 2022-06-29 DIAGNOSIS — N179 Acute kidney failure, unspecified: Secondary | ICD-10-CM

## 2022-07-04 DIAGNOSIS — E782 Mixed hyperlipidemia: Secondary | ICD-10-CM | POA: Diagnosis not present

## 2022-07-04 DIAGNOSIS — I10 Essential (primary) hypertension: Secondary | ICD-10-CM | POA: Diagnosis not present

## 2022-07-04 DIAGNOSIS — E1169 Type 2 diabetes mellitus with other specified complication: Secondary | ICD-10-CM | POA: Diagnosis not present

## 2022-07-26 ENCOUNTER — Other Ambulatory Visit: Payer: Self-pay | Admitting: Student

## 2022-07-26 DIAGNOSIS — N179 Acute kidney failure, unspecified: Secondary | ICD-10-CM

## 2022-07-26 DIAGNOSIS — Z01818 Encounter for other preprocedural examination: Secondary | ICD-10-CM

## 2022-07-27 ENCOUNTER — Other Ambulatory Visit: Payer: Self-pay | Admitting: Student

## 2022-07-30 ENCOUNTER — Other Ambulatory Visit (HOSPITAL_COMMUNITY): Payer: Self-pay | Admitting: Nephrology

## 2022-07-30 ENCOUNTER — Encounter (HOSPITAL_COMMUNITY): Payer: Self-pay

## 2022-07-30 ENCOUNTER — Ambulatory Visit (HOSPITAL_COMMUNITY)
Admission: RE | Admit: 2022-07-30 | Discharge: 2022-07-30 | Disposition: A | Discharge status: Home / Self Care | Payer: Medicare HMO | Source: Ambulatory Visit | Attending: Nephrology | Admitting: Nephrology

## 2022-07-30 DIAGNOSIS — Z85820 Personal history of malignant melanoma of skin: Secondary | ICD-10-CM | POA: Diagnosis not present

## 2022-07-30 DIAGNOSIS — R7989 Other specified abnormal findings of blood chemistry: Secondary | ICD-10-CM | POA: Diagnosis not present

## 2022-07-30 DIAGNOSIS — N179 Acute kidney failure, unspecified: Secondary | ICD-10-CM | POA: Insufficient documentation

## 2022-07-30 DIAGNOSIS — Z01818 Encounter for other preprocedural examination: Secondary | ICD-10-CM

## 2022-07-30 LAB — CBC WITH DIFFERENTIAL/PLATELET
Abs Immature Granulocytes: 0.02 10*3/uL (ref 0.00–0.07)
Basophils Absolute: 0 10*3/uL (ref 0.0–0.1)
Basophils Relative: 1 %
Eosinophils Absolute: 0.5 10*3/uL (ref 0.0–0.5)
Eosinophils Relative: 8 %
HCT: 39.5 % (ref 39.0–52.0)
Hemoglobin: 13 g/dL (ref 13.0–17.0)
Immature Granulocytes: 0 %
Lymphocytes Relative: 36 %
Lymphs Abs: 2.5 10*3/uL (ref 0.7–4.0)
MCH: 28.7 pg (ref 26.0–34.0)
MCHC: 32.9 g/dL (ref 30.0–36.0)
MCV: 87.2 fL (ref 80.0–100.0)
Monocytes Absolute: 0.5 10*3/uL (ref 0.1–1.0)
Monocytes Relative: 7 %
Neutro Abs: 3.3 10*3/uL (ref 1.7–7.7)
Neutrophils Relative %: 48 %
Platelets: 217 10*3/uL (ref 150–400)
RBC: 4.53 MIL/uL (ref 4.22–5.81)
RDW: 12.9 % (ref 11.5–15.5)
WBC: 6.8 10*3/uL (ref 4.0–10.5)
nRBC: 0 % (ref 0.0–0.2)

## 2022-07-30 LAB — PROTIME-INR
INR: 1 (ref 0.8–1.2)
Prothrombin Time: 13 seconds (ref 11.4–15.2)

## 2022-07-30 LAB — GLUCOSE, CAPILLARY
Glucose-Capillary: 112 mg/dL — ABNORMAL HIGH (ref 70–99)
Glucose-Capillary: 98 mg/dL (ref 70–99)

## 2022-07-30 MED ORDER — MIDAZOLAM HCL 2 MG/2ML IJ SOLN
INTRAMUSCULAR | Status: AC
  Filled 2022-07-30: qty 2

## 2022-07-30 MED ORDER — FENTANYL CITRATE (PF) 100 MCG/2ML IJ SOLN
INTRAMUSCULAR | Status: AC
  Filled 2022-07-30: qty 2

## 2022-07-30 MED ORDER — GELATIN ABSORBABLE 12-7 MM EX MISC
CUTANEOUS | Status: AC
  Filled 2022-07-30: qty 1

## 2022-07-30 MED ORDER — LIDOCAINE HCL 1 % IJ SOLN
INTRAMUSCULAR | Status: AC
  Filled 2022-07-30: qty 10

## 2022-07-30 MED ORDER — FENTANYL CITRATE (PF) 100 MCG/2ML IJ SOLN
INTRAMUSCULAR | Status: AC | PRN
  Administered 2022-07-30 (×2): 25 ug via INTRAVENOUS
  Administered 2022-07-30: 50 ug via INTRAVENOUS
  Administered 2022-07-30: 25 ug via INTRAVENOUS

## 2022-07-30 MED ORDER — MIDAZOLAM HCL 2 MG/2ML IJ SOLN
INTRAMUSCULAR | Status: AC | PRN
  Administered 2022-07-30: 1 mg via INTRAVENOUS
  Administered 2022-07-30: .5 mg via INTRAVENOUS
  Administered 2022-07-30: 1 mg via INTRAVENOUS
  Administered 2022-07-30: .5 mg via INTRAVENOUS

## 2022-07-30 MED ORDER — SODIUM CHLORIDE 0.9 % IV SOLN: INTRAVENOUS | Status: DC

## 2022-07-30 MED ORDER — LIDOCAINE HCL (PF) 1 % IJ SOLN
INTRAMUSCULAR | Status: AC
  Filled 2022-07-30: qty 30

## 2022-07-30 NOTE — Sedation Documentation (Signed)
Pt transported to CT from Ultrasound via stretcher accompanied by RN. Pt transferred to CT table from stretcher independently. VSS. No s/s of distress at this time.

## 2022-07-30 NOTE — Procedures (Signed)
Interventional Radiology Procedure Note  Procedure: CT LEFT RENAL CORE BX    Complications: None  Estimated Blood Loss:  MIN  Findings: 23 G X 2    M. Daryll Brod, MD

## 2022-07-30 NOTE — H&P (Signed)
Chief Complaint: Patient was seen in consultation today for random renal biopsy at the request of [place]  Referring Physician(s): [place]  Supervising Physician: Daryll Brod  Patient Status: [facility] - Out-pt  History of Present Illness: Jesse Perez is a 71 y.o. male   Pt with malignant melanoma Meds for same has caused continued rise in Creatinine GFR 12 Was referred to Dr Hollie Salk She has requested random renal biopsy  Pt sees Oncologist in [place]    Past Medical History:  Diagnosis Date   AAA (abdominal aortic aneurysm) ([place])    followed by pcp;   last CTA in epic 06-17-2020 , 4.0cm   Anal fistula    Bradycardia    DDD (degenerative disc disease), lumbar    Family history of colon cancer - grandfather and CHEK mutation increasing colon cancer risk 10/03/2015   GERD (gastroesophageal reflux disease)    Hemochromatosis carrier    Hemorrhoids    History of adenomatous polyp of colon    followed by dr Carlean Purl   History of asthma    child   History of atrial fibrillation    remote hx episdoe atrial fib   History of COVID-19 05/2021   per pt mild sympomts that resolved   History of lower GI bleeding 10/2015   post multiple colonscopy polypectomy's   History of prostate cancer 2011   urologist--- dr Diona Fanti;  primary prostate cancer;  06-30-2010  s/p radial prostatectomy w/ node dissection's '@[facility]' ,  no recurrence per pt   Hyperlipidemia    Hypertension    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 collichio;  dx 40/ 3474;   08-01-2021  s/p WLE left lateral back w/ left axilla node dissection's (positive nodes), Stage III;  chemo started 10-02-2021 q28d   Nocturia    Nodule of left lung 2018   followed by oncology--- [facility]   Obstructive sleep apnea    does not use CPAP, intolerent;  mild osa per sleep study in epic 10-02-2011   Port-A-Cath in place    PVC's (premature ventricular contractions)     cardiologist-- dr g. taylor for remote hx atrial fib,  symptomatic PVCs bigeminy  (04-02-2022  per pt no longer is symptomatic with irregular heart beat)   Type 2 diabetes mellitus ([place])    followed by pcp   (04-02-2022  per pt does not check blood sugar at home)   Wears contact lenses    Wears hearing aid in both ears     Past Surgical History:  Procedure Laterality Date   APPENDECTOMY     1980s   COLONOSCOPY WITH PROPOFOL  02/15/2022   by dr Carlean Purl   COLONOSCOPY WITH PROPOFOL N/A 10/11/2015   Procedure: COLONOSCOPY WITH PROPOFOL;  Surgeon: Irene Shipper, MD;  Location: [facility];  Service: Endoscopy;  Laterality: N/A;   CYSTOSCOPY WITH URETHRAL DILATATION  11/13/2010   '@[facility]'  by dr Diona Fanti;   Dilatation urethral meatus and balloon dilatation bladder neck   EXCISION MELANOMA WITH SENTINEL LYMPH NODE BIOPSY  08/01/2021   '@[facility]'  by dr Harriet Masson;   WLE left lateral back w/ left axilla node dissection's   FISTULOTOMY N/A 04/09/2022   Procedure: FISTULOTOMY, ANAL BIOPSY;  Surgeon: Ileana Roup, MD;  Location: [place];  Service: General;  Laterality: N/A;   LUMBAR LAMINECTOMY  02/27/2000   '@[facility]'  by dr Carloyn Manner;;  L3  and L4   RECTAL EXAM UNDER ANESTHESIA N/A 04/09/2022   Procedure: ANORECTAL  EXAM UNDER ANESTHESIA;  Surgeon: Ileana Roup, MD;  [facility];  Service: General;  Laterality: N/A;   ROBOT ASSISTED LAPAROSCOPIC RADICAL PROSTATECTOMY  06/30/2010   '@[facility]'     Allergies: Penicillins, Zolpidem tartrate, and Metoprolol  Medications: Prior to Admission medications   Medication Sig Start Date End Date Taking? Authorizing Provider  acetaminophen (TYLENOL) 500 MG tablet Take 1,000 mg by mouth every 6 (six) hours as needed for moderate pain.   Yes [provider]  calcium carbonate (TUMS - DOSED IN MG ELEMENTAL CALCIUM) 500 MG chewable tablet Chew 1 tablet by mouth daily as needed for indigestion or heartburn.   Yes  [provider]  fluticasone (FLONASE) 50 MCG/ACT nasal spray Place 2 sprays into both nostrils at bedtime as needed for allergies. 01/22/22  Yes [provider]  losartan (COZAAR) 25 MG tablet Take 25 mg by mouth daily.   Yes [provider]  metFORMIN (GLUCOPHAGE-XR) 500 MG 24 hr tablet Take 1,000 mg by mouth daily. 01/21/22  Yes [provider]  rosuvastatin (CRESTOR) 5 MG tablet Take 5 mg by mouth daily.   Yes [provider]  Semaglutide (OZEMPIC, 0.25 OR 0.5 MG/DOSE, [place]) Inject 0.5 mg into the skin every Wednesday.   Yes [provider]  albuterol (VENTOLIN HFA) 108 (90 Base) MCG/ACT inhaler Inhale 2 puffs into the lungs every 6 (six) hours as needed for wheezing. 01/04/22   [provider]  desonide (DESOWEN) 0.05 % lotion Apply 1 Application topically daily as needed (rash). 11/16/21   [provider]  loratadine (CLARITIN) 10 MG tablet Take 10 mg by mouth daily as needed for allergies.     [provider]     Family History  Problem Relation Age of Onset   Breast cancer Mother 31   Cancer Mother    Hypertension Father    Stroke Father    Breast cancer Sister 42       BRCA neg   Colon polyps Sister    Breast cancer Maternal Grandmother        dx in her 19s   Breast cancer Daughter 81       ATM and CHEK2 carrier   Hemochromatosis Daughter 80       C282Y and S65C double heterozygote   Colon cancer Paternal Grandfather    Crohn's disease Neg Hx    Liver cancer Neg Hx    Pancreatic cancer Neg Hx    Rectal cancer Neg Hx    Stomach cancer Neg Hx    Ulcerative colitis Neg Hx    Esophageal cancer Neg Hx     Social History   Socioeconomic History   Marital status: Married    Spouse name: Not on file   Number of children: 3   Years of education: Not on file   Highest education level: Not on file  Occupational History    Employer: [employer]  Tobacco Use   Smoking status: Former    Years: 27.00     Types: Cigarettes    Quit date: 1999    Years since quitting: 24.5   Smokeless tobacco: Never  Vaping Use   Vaping Use: Never used  Substance and Sexual Activity   Alcohol use: Yes    Comment: occasional   Drug use: Never   Sexual activity: Not on file  Other Topics Concern   Not on file  Social History Narrative   Not on file   Social Determinants of Health  Financial Resource Strain: Not on file  Food Insecurity: Not on file  Transportation Needs: Not on file  Physical Activity: Not on file  Stress: Not on file  Social Connections: Not on file    Review of Systems: A 12 point ROS discussed and pertinent positives are indicated in the HPI above.  All other systems are negative.  Review of Systems  Constitutional:  Negative for activity change, fatigue and fever.  Respiratory:  Negative for cough and shortness of breath.   Gastrointestinal:  Negative for abdominal pain.  Musculoskeletal:  Negative for back pain.  Neurological:  Negative for weakness.  Psychiatric/Behavioral:  Negative for behavioral problems and confusion.     Vital Signs: BP 138/85   Pulse 62   Temp 97.6 F (36.4 C) (Oral)   Resp 16   Ht '5\' 9"'  (1.753 m)   Wt 184 lb (83.5 kg)   SpO2 98%   BMI 27.17 kg/m     Physical Exam Vitals reviewed.  HENT:     Mouth/Throat:     Mouth: Mucous membranes are moist.  Cardiovascular:     Rate and Rhythm: Normal rate and regular rhythm.     Heart sounds: No murmur heard. Pulmonary:     Effort: Pulmonary effort is normal.     Breath sounds: Normal breath sounds.  Abdominal:     General: Bowel sounds are normal.     Palpations: Abdomen is soft.     Tenderness: There is no abdominal tenderness.  Musculoskeletal:        General: No swelling. Normal range of motion.  Skin:    General: Skin is warm.  Neurological:     Mental Status: He is alert and oriented to person, place, and time.  Psychiatric:        Behavior: Behavior normal.      Imaging: No results found.  Labs:  CBC: Recent Labs    04/09/22 0812  WBC 9.8  HGB 14.8  HCT 43.0  PLT 205    COAGS: No results for input(s): "INR", "APTT" in the last 8760 hours.  BMP: Recent Labs    04/09/22 0812  NA 138  K 3.6  CL 104  CO2 25  GLUCOSE 136*  BUN 28*  CALCIUM 9.3  CREATININE 1.82*  GFRNONAA 40*    LIVER FUNCTION TESTS: Recent Labs    04/09/22 0812  BILITOT 2.4*  AST 27  ALT 39  ALKPHOS 52  PROT 7.4  ALBUMIN 4.7    TUMOR MARKERS: No results for input(s): "AFPTM", "CEA", "CA199", "CHROMGRNA" in the last 8760 hours.  Assessment and Plan:  Scheduled for random renal biopsy Risks and benefits of random renal biopsy was discussed with the patient and/or patient's family including, but not limited to bleeding, infection, damage to adjacent structures or low yield requiring additional tests.  All of the questions were answered and there is agreement to proceed. Consent signed and in chart.   Thank you for this interesting consult.  I greatly enjoyed meeting EDNA GROVER and look forward to participating in their care.  A copy of this report was sent to the requesting provider on this date.  Electronically Signed: Lavonia Drafts, PA-C 07/30/2022, 7:01 AM   I spent a total of  30 Minutes   in face to face in clinical consultation, greater than 50% of which was counseling/coordinating care for random renal biopsy

## 2022-08-01 DIAGNOSIS — E785 Hyperlipidemia, unspecified: Secondary | ICD-10-CM | POA: Diagnosis not present

## 2022-08-01 DIAGNOSIS — N179 Acute kidney failure, unspecified: Secondary | ICD-10-CM | POA: Diagnosis not present

## 2022-08-01 DIAGNOSIS — E1122 Type 2 diabetes mellitus with diabetic chronic kidney disease: Secondary | ICD-10-CM | POA: Diagnosis not present

## 2022-08-01 DIAGNOSIS — C439 Malignant melanoma of skin, unspecified: Secondary | ICD-10-CM | POA: Diagnosis not present

## 2022-08-01 DIAGNOSIS — I129 Hypertensive chronic kidney disease with stage 1 through stage 4 chronic kidney disease, or unspecified chronic kidney disease: Secondary | ICD-10-CM | POA: Diagnosis not present

## 2022-08-02 ENCOUNTER — Encounter (HOSPITAL_COMMUNITY): Payer: Self-pay

## 2022-08-02 LAB — SURGICAL PATHOLOGY

## 2022-09-20 DIAGNOSIS — J069 Acute upper respiratory infection, unspecified: Secondary | ICD-10-CM | POA: Diagnosis not present

## 2022-09-24 DIAGNOSIS — Z7952 Long term (current) use of systemic steroids: Secondary | ICD-10-CM | POA: Diagnosis not present

## 2022-09-24 DIAGNOSIS — Z7984 Long term (current) use of oral hypoglycemic drugs: Secondary | ICD-10-CM | POA: Diagnosis not present

## 2022-09-24 DIAGNOSIS — R911 Solitary pulmonary nodule: Secondary | ICD-10-CM | POA: Diagnosis not present

## 2022-09-24 DIAGNOSIS — M799 Soft tissue disorder, unspecified: Secondary | ICD-10-CM | POA: Diagnosis not present

## 2022-09-24 DIAGNOSIS — Z79899 Other long term (current) drug therapy: Secondary | ICD-10-CM | POA: Diagnosis not present

## 2022-09-24 DIAGNOSIS — M25641 Stiffness of right hand, not elsewhere classified: Secondary | ICD-10-CM | POA: Diagnosis not present

## 2022-09-24 DIAGNOSIS — Z803 Family history of malignant neoplasm of breast: Secondary | ICD-10-CM | POA: Diagnosis not present

## 2022-09-24 DIAGNOSIS — C439 Malignant melanoma of skin, unspecified: Secondary | ICD-10-CM | POA: Diagnosis not present

## 2022-09-24 DIAGNOSIS — M25642 Stiffness of left hand, not elsewhere classified: Secondary | ICD-10-CM | POA: Diagnosis not present

## 2022-09-24 DIAGNOSIS — C4359 Malignant melanoma of other part of trunk: Secondary | ICD-10-CM | POA: Diagnosis not present

## 2022-09-24 DIAGNOSIS — Z7985 Long-term (current) use of injectable non-insulin antidiabetic drugs: Secondary | ICD-10-CM | POA: Diagnosis not present

## 2022-09-24 DIAGNOSIS — R918 Other nonspecific abnormal finding of lung field: Secondary | ICD-10-CM | POA: Diagnosis not present

## 2022-09-24 DIAGNOSIS — K209 Esophagitis, unspecified without bleeding: Secondary | ICD-10-CM | POA: Diagnosis not present

## 2022-10-03 DIAGNOSIS — E1122 Type 2 diabetes mellitus with diabetic chronic kidney disease: Secondary | ICD-10-CM | POA: Diagnosis not present

## 2022-10-03 DIAGNOSIS — C439 Malignant melanoma of skin, unspecified: Secondary | ICD-10-CM | POA: Diagnosis not present

## 2022-10-03 DIAGNOSIS — N179 Acute kidney failure, unspecified: Secondary | ICD-10-CM | POA: Diagnosis not present

## 2022-10-03 DIAGNOSIS — I129 Hypertensive chronic kidney disease with stage 1 through stage 4 chronic kidney disease, or unspecified chronic kidney disease: Secondary | ICD-10-CM | POA: Diagnosis not present

## 2022-10-03 DIAGNOSIS — E785 Hyperlipidemia, unspecified: Secondary | ICD-10-CM | POA: Diagnosis not present

## 2022-10-09 DIAGNOSIS — Z7984 Long term (current) use of oral hypoglycemic drugs: Secondary | ICD-10-CM | POA: Diagnosis not present

## 2022-10-09 DIAGNOSIS — E119 Type 2 diabetes mellitus without complications: Secondary | ICD-10-CM | POA: Diagnosis not present

## 2022-10-09 DIAGNOSIS — R911 Solitary pulmonary nodule: Secondary | ICD-10-CM | POA: Diagnosis not present

## 2022-10-09 DIAGNOSIS — E7800 Pure hypercholesterolemia, unspecified: Secondary | ICD-10-CM | POA: Diagnosis not present

## 2022-10-09 DIAGNOSIS — Z87891 Personal history of nicotine dependence: Secondary | ICD-10-CM | POA: Diagnosis not present

## 2022-10-09 DIAGNOSIS — I10 Essential (primary) hypertension: Secondary | ICD-10-CM | POA: Diagnosis not present

## 2022-10-09 DIAGNOSIS — Z86006 Personal history of melanoma in-situ: Secondary | ICD-10-CM | POA: Diagnosis not present

## 2022-10-09 DIAGNOSIS — Z9889 Other specified postprocedural states: Secondary | ICD-10-CM | POA: Diagnosis not present

## 2022-10-09 DIAGNOSIS — R918 Other nonspecific abnormal finding of lung field: Secondary | ICD-10-CM | POA: Diagnosis not present

## 2022-10-17 DIAGNOSIS — M2022 Hallux rigidus, left foot: Secondary | ICD-10-CM | POA: Diagnosis not present

## 2022-10-17 DIAGNOSIS — M79672 Pain in left foot: Secondary | ICD-10-CM | POA: Diagnosis not present

## 2022-10-24 DIAGNOSIS — Z9889 Other specified postprocedural states: Secondary | ICD-10-CM | POA: Diagnosis not present

## 2022-10-24 DIAGNOSIS — Z4682 Encounter for fitting and adjustment of non-vascular catheter: Secondary | ICD-10-CM | POA: Diagnosis not present

## 2022-10-24 DIAGNOSIS — Z8546 Personal history of malignant neoplasm of prostate: Secondary | ICD-10-CM | POA: Diagnosis not present

## 2022-10-24 DIAGNOSIS — Z7984 Long term (current) use of oral hypoglycemic drugs: Secondary | ICD-10-CM | POA: Diagnosis not present

## 2022-10-24 DIAGNOSIS — I10 Essential (primary) hypertension: Secondary | ICD-10-CM | POA: Diagnosis not present

## 2022-10-24 DIAGNOSIS — R911 Solitary pulmonary nodule: Secondary | ICD-10-CM | POA: Diagnosis not present

## 2022-10-24 DIAGNOSIS — Z87891 Personal history of nicotine dependence: Secondary | ICD-10-CM | POA: Diagnosis not present

## 2022-10-24 DIAGNOSIS — Z880 Allergy status to penicillin: Secondary | ICD-10-CM | POA: Diagnosis not present

## 2022-10-24 DIAGNOSIS — E785 Hyperlipidemia, unspecified: Secondary | ICD-10-CM | POA: Diagnosis not present

## 2022-10-24 DIAGNOSIS — R918 Other nonspecific abnormal finding of lung field: Secondary | ICD-10-CM | POA: Diagnosis not present

## 2022-10-24 DIAGNOSIS — C3412 Malignant neoplasm of upper lobe, left bronchus or lung: Secondary | ICD-10-CM | POA: Diagnosis not present

## 2022-10-24 DIAGNOSIS — J811 Chronic pulmonary edema: Secondary | ICD-10-CM | POA: Diagnosis not present

## 2022-10-24 DIAGNOSIS — Z9079 Acquired absence of other genital organ(s): Secondary | ICD-10-CM | POA: Diagnosis not present

## 2022-10-24 DIAGNOSIS — E119 Type 2 diabetes mellitus without complications: Secondary | ICD-10-CM | POA: Diagnosis not present

## 2022-10-24 DIAGNOSIS — Z888 Allergy status to other drugs, medicaments and biological substances status: Secondary | ICD-10-CM | POA: Diagnosis not present

## 2022-10-25 DIAGNOSIS — J811 Chronic pulmonary edema: Secondary | ICD-10-CM | POA: Diagnosis not present

## 2022-10-25 DIAGNOSIS — Z9889 Other specified postprocedural states: Secondary | ICD-10-CM | POA: Diagnosis not present

## 2022-10-25 DIAGNOSIS — Z4682 Encounter for fitting and adjustment of non-vascular catheter: Secondary | ICD-10-CM | POA: Diagnosis not present

## 2022-10-26 DIAGNOSIS — J939 Pneumothorax, unspecified: Secondary | ICD-10-CM | POA: Diagnosis not present

## 2022-10-26 DIAGNOSIS — Z9889 Other specified postprocedural states: Secondary | ICD-10-CM | POA: Diagnosis not present

## 2022-11-01 DIAGNOSIS — D3617 Benign neoplasm of peripheral nerves and autonomic nervous system of trunk, unspecified: Secondary | ICD-10-CM | POA: Diagnosis not present

## 2022-11-01 DIAGNOSIS — D225 Melanocytic nevi of trunk: Secondary | ICD-10-CM | POA: Diagnosis not present

## 2022-11-01 DIAGNOSIS — D2271 Melanocytic nevi of right lower limb, including hip: Secondary | ICD-10-CM | POA: Diagnosis not present

## 2022-11-01 DIAGNOSIS — D2272 Melanocytic nevi of left lower limb, including hip: Secondary | ICD-10-CM | POA: Diagnosis not present

## 2022-11-01 DIAGNOSIS — L821 Other seborrheic keratosis: Secondary | ICD-10-CM | POA: Diagnosis not present

## 2022-11-01 DIAGNOSIS — D2262 Melanocytic nevi of left upper limb, including shoulder: Secondary | ICD-10-CM | POA: Diagnosis not present

## 2022-11-01 DIAGNOSIS — L738 Other specified follicular disorders: Secondary | ICD-10-CM | POA: Diagnosis not present

## 2022-11-01 DIAGNOSIS — Z85820 Personal history of malignant melanoma of skin: Secondary | ICD-10-CM | POA: Diagnosis not present

## 2022-11-01 DIAGNOSIS — D2261 Melanocytic nevi of right upper limb, including shoulder: Secondary | ICD-10-CM | POA: Diagnosis not present

## 2022-11-13 DIAGNOSIS — Z9889 Other specified postprocedural states: Secondary | ICD-10-CM | POA: Diagnosis not present

## 2022-11-13 DIAGNOSIS — R110 Nausea: Secondary | ICD-10-CM | POA: Diagnosis not present

## 2022-11-13 DIAGNOSIS — Z09 Encounter for follow-up examination after completed treatment for conditions other than malignant neoplasm: Secondary | ICD-10-CM | POA: Diagnosis not present

## 2022-11-13 DIAGNOSIS — C3412 Malignant neoplasm of upper lobe, left bronchus or lung: Secondary | ICD-10-CM | POA: Diagnosis not present

## 2022-11-13 DIAGNOSIS — R490 Dysphonia: Secondary | ICD-10-CM | POA: Diagnosis not present

## 2022-11-13 DIAGNOSIS — E785 Hyperlipidemia, unspecified: Secondary | ICD-10-CM | POA: Diagnosis not present

## 2022-11-13 DIAGNOSIS — R911 Solitary pulmonary nodule: Secondary | ICD-10-CM | POA: Diagnosis not present

## 2022-11-13 DIAGNOSIS — E1140 Type 2 diabetes mellitus with diabetic neuropathy, unspecified: Secondary | ICD-10-CM | POA: Diagnosis not present

## 2022-11-13 DIAGNOSIS — I10 Essential (primary) hypertension: Secondary | ICD-10-CM | POA: Diagnosis not present

## 2022-11-13 DIAGNOSIS — R197 Diarrhea, unspecified: Secondary | ICD-10-CM | POA: Diagnosis not present

## 2022-11-14 DIAGNOSIS — E1122 Type 2 diabetes mellitus with diabetic chronic kidney disease: Secondary | ICD-10-CM | POA: Diagnosis not present

## 2022-11-14 DIAGNOSIS — E785 Hyperlipidemia, unspecified: Secondary | ICD-10-CM | POA: Diagnosis not present

## 2022-11-14 DIAGNOSIS — N10 Acute tubulo-interstitial nephritis: Secondary | ICD-10-CM | POA: Diagnosis not present

## 2022-11-14 DIAGNOSIS — C439 Malignant melanoma of skin, unspecified: Secondary | ICD-10-CM | POA: Diagnosis not present

## 2022-11-14 DIAGNOSIS — C3490 Malignant neoplasm of unspecified part of unspecified bronchus or lung: Secondary | ICD-10-CM | POA: Diagnosis not present

## 2022-11-14 DIAGNOSIS — I129 Hypertensive chronic kidney disease with stage 1 through stage 4 chronic kidney disease, or unspecified chronic kidney disease: Secondary | ICD-10-CM | POA: Diagnosis not present

## 2022-11-14 DIAGNOSIS — N179 Acute kidney failure, unspecified: Secondary | ICD-10-CM | POA: Diagnosis not present

## 2022-11-28 DIAGNOSIS — E1140 Type 2 diabetes mellitus with diabetic neuropathy, unspecified: Secondary | ICD-10-CM | POA: Diagnosis not present

## 2022-11-28 DIAGNOSIS — M2022 Hallux rigidus, left foot: Secondary | ICD-10-CM | POA: Diagnosis not present

## 2022-11-30 DIAGNOSIS — E785 Hyperlipidemia, unspecified: Secondary | ICD-10-CM | POA: Diagnosis not present

## 2022-11-30 DIAGNOSIS — N179 Acute kidney failure, unspecified: Secondary | ICD-10-CM | POA: Diagnosis not present

## 2022-11-30 DIAGNOSIS — I700 Atherosclerosis of aorta: Secondary | ICD-10-CM | POA: Diagnosis not present

## 2022-11-30 DIAGNOSIS — Z85118 Personal history of other malignant neoplasm of bronchus and lung: Secondary | ICD-10-CM | POA: Diagnosis not present

## 2022-11-30 DIAGNOSIS — Z1331 Encounter for screening for depression: Secondary | ICD-10-CM | POA: Diagnosis not present

## 2022-11-30 DIAGNOSIS — I10 Essential (primary) hypertension: Secondary | ICD-10-CM | POA: Diagnosis not present

## 2022-11-30 DIAGNOSIS — Z86010 Personal history of colonic polyps: Secondary | ICD-10-CM | POA: Diagnosis not present

## 2022-11-30 DIAGNOSIS — I712 Thoracic aortic aneurysm, without rupture, unspecified: Secondary | ICD-10-CM | POA: Diagnosis not present

## 2022-11-30 DIAGNOSIS — Z8546 Personal history of malignant neoplasm of prostate: Secondary | ICD-10-CM | POA: Diagnosis not present

## 2022-11-30 DIAGNOSIS — Z0000 Encounter for general adult medical examination without abnormal findings: Secondary | ICD-10-CM | POA: Diagnosis not present

## 2022-11-30 DIAGNOSIS — E1169 Type 2 diabetes mellitus with other specified complication: Secondary | ICD-10-CM | POA: Diagnosis not present

## 2022-11-30 DIAGNOSIS — C439 Malignant melanoma of skin, unspecified: Secondary | ICD-10-CM | POA: Diagnosis not present

## 2022-12-17 DIAGNOSIS — E119 Type 2 diabetes mellitus without complications: Secondary | ICD-10-CM | POA: Diagnosis not present

## 2022-12-17 DIAGNOSIS — N059 Unspecified nephritic syndrome with unspecified morphologic changes: Secondary | ICD-10-CM | POA: Diagnosis not present

## 2022-12-17 DIAGNOSIS — C3490 Malignant neoplasm of unspecified part of unspecified bronchus or lung: Secondary | ICD-10-CM | POA: Diagnosis not present

## 2022-12-17 DIAGNOSIS — I10 Essential (primary) hypertension: Secondary | ICD-10-CM | POA: Diagnosis not present

## 2022-12-17 DIAGNOSIS — C4359 Malignant melanoma of other part of trunk: Secondary | ICD-10-CM | POA: Diagnosis not present

## 2022-12-17 DIAGNOSIS — Z483 Aftercare following surgery for neoplasm: Secondary | ICD-10-CM | POA: Diagnosis not present

## 2022-12-17 DIAGNOSIS — Z803 Family history of malignant neoplasm of breast: Secondary | ICD-10-CM | POA: Diagnosis not present

## 2022-12-17 DIAGNOSIS — C439 Malignant melanoma of skin, unspecified: Secondary | ICD-10-CM | POA: Diagnosis not present

## 2022-12-17 DIAGNOSIS — Z79899 Other long term (current) drug therapy: Secondary | ICD-10-CM | POA: Diagnosis not present

## 2022-12-17 DIAGNOSIS — C3412 Malignant neoplasm of upper lobe, left bronchus or lung: Secondary | ICD-10-CM | POA: Diagnosis not present

## 2022-12-17 DIAGNOSIS — E785 Hyperlipidemia, unspecified: Secondary | ICD-10-CM | POA: Diagnosis not present

## 2023-01-30 DIAGNOSIS — E785 Hyperlipidemia, unspecified: Secondary | ICD-10-CM | POA: Diagnosis not present

## 2023-01-30 DIAGNOSIS — I129 Hypertensive chronic kidney disease with stage 1 through stage 4 chronic kidney disease, or unspecified chronic kidney disease: Secondary | ICD-10-CM | POA: Diagnosis not present

## 2023-01-30 DIAGNOSIS — E1122 Type 2 diabetes mellitus with diabetic chronic kidney disease: Secondary | ICD-10-CM | POA: Diagnosis not present

## 2023-01-30 DIAGNOSIS — N10 Acute tubulo-interstitial nephritis: Secondary | ICD-10-CM | POA: Diagnosis not present

## 2023-01-30 DIAGNOSIS — N179 Acute kidney failure, unspecified: Secondary | ICD-10-CM | POA: Diagnosis not present

## 2023-01-30 DIAGNOSIS — C439 Malignant melanoma of skin, unspecified: Secondary | ICD-10-CM | POA: Diagnosis not present

## 2023-01-30 DIAGNOSIS — C3490 Malignant neoplasm of unspecified part of unspecified bronchus or lung: Secondary | ICD-10-CM | POA: Diagnosis not present

## 2023-02-04 DIAGNOSIS — M79676 Pain in unspecified toe(s): Secondary | ICD-10-CM | POA: Diagnosis not present

## 2023-02-04 DIAGNOSIS — R0981 Nasal congestion: Secondary | ICD-10-CM | POA: Diagnosis not present

## 2023-02-07 ENCOUNTER — Encounter: Payer: Self-pay | Admitting: Internal Medicine

## 2023-02-27 ENCOUNTER — Encounter: Payer: Self-pay | Admitting: Internal Medicine

## 2023-02-27 ENCOUNTER — Ambulatory Visit (AMBULATORY_SURGERY_CENTER): Payer: Medicare HMO | Admitting: *Deleted

## 2023-02-27 VITALS — Ht 70.0 in | Wt 195.0 lb

## 2023-02-27 DIAGNOSIS — Z800 Family history of malignant neoplasm of digestive organs: Secondary | ICD-10-CM

## 2023-02-27 DIAGNOSIS — Z86010 Personal history of colonic polyps: Secondary | ICD-10-CM

## 2023-02-27 NOTE — Progress Notes (Signed)
No egg or soy allergy known to patient  No issues known to pt with past sedation with any surgeries or procedures Patient denies ever being told they had issues or difficulty with intubation  No FH of Malignant Hyperthermia Pt is not on diet pills Pt is not on  home 02  Pt is not on blood thinners  Pt denies issues with constipation  No A fib or A flutter Have any cardiac testing pending--no Pt instructed to use [employer] or GoodRx for a price reduction on prep    Sample sheet of over the counter items to purchase for prep mailed with packet.  Patient's chart reviewed by Jesse Perez CNRA prior to previsit and patient appropriate for the [place].  Previsit completed and red dot placed by patient's name on their procedure day (on provider's schedule).

## 2023-03-11 DIAGNOSIS — C438 Malignant melanoma of overlapping sites of skin: Secondary | ICD-10-CM | POA: Diagnosis not present

## 2023-03-11 DIAGNOSIS — C439 Malignant melanoma of skin, unspecified: Secondary | ICD-10-CM | POA: Diagnosis not present

## 2023-03-19 ENCOUNTER — Ambulatory Visit (AMBULATORY_SURGERY_CENTER): Payer: Medicare HMO | Admitting: Internal Medicine

## 2023-03-19 ENCOUNTER — Encounter: Payer: Self-pay | Admitting: Internal Medicine

## 2023-03-19 VITALS — BP 115/79 | HR 68 | Temp 98.6°F | Resp 17 | Ht 70.0 in | Wt 195.0 lb

## 2023-03-19 DIAGNOSIS — D122 Benign neoplasm of ascending colon: Secondary | ICD-10-CM

## 2023-03-19 DIAGNOSIS — Z800 Family history of malignant neoplasm of digestive organs: Secondary | ICD-10-CM

## 2023-03-19 DIAGNOSIS — K635 Polyp of colon: Secondary | ICD-10-CM | POA: Diagnosis not present

## 2023-03-19 DIAGNOSIS — Z86010 Personal history of colonic polyps: Secondary | ICD-10-CM | POA: Diagnosis not present

## 2023-03-19 DIAGNOSIS — D125 Benign neoplasm of sigmoid colon: Secondary | ICD-10-CM | POA: Diagnosis not present

## 2023-03-19 DIAGNOSIS — Z09 Encounter for follow-up examination after completed treatment for conditions other than malignant neoplasm: Secondary | ICD-10-CM | POA: Diagnosis not present

## 2023-03-19 MED ORDER — SODIUM CHLORIDE 0.9 % IV SOLN: 500.0000 mL | Freq: Once | INTRAVENOUS | Status: AC

## 2023-03-19 NOTE — Progress Notes (Unsigned)
Uneventful anesthetic. Report to pacu rn. Vss. Care resumed by rn. 

## 2023-03-19 NOTE — Progress Notes (Signed)
Pt's states no medical or surgical changes since previsit or office visit. 

## 2023-03-19 NOTE — Op Note (Signed)
[place] Patient Name: Jesse Perez Procedure Date: 03/19/2023 11:53 AM MRN: [id number] Endoscopist: Gatha Mayer , MD, [phone] Age: 72 Referring MD:  Date of Birth: 09/25/1951 Gender: Male Account #: [id number] Procedure:                Colonoscopy Indications:              Surveillance: History of numerous (> 10) adenomas                            on last colonoscopy (< 3 yrs) Medicines:                Monitored Anesthesia Care Procedure:                Pre-Anesthesia Assessment:                           - Prior to the procedure, a History and Physical                            was performed, and patient medications and                            allergies were reviewed. The patient's tolerance of                            previous anesthesia was also reviewed. The risks                            and benefits of the procedure and the sedation                            options and risks were discussed with the patient.                            All questions were answered, and informed consent                            was obtained. Prior Anticoagulants: The patient has                            taken no anticoagulant or antiplatelet agents. ASA                            Grade Assessment: III - A patient with severe                            systemic disease. After reviewing the risks and                            benefits, the patient was deemed in satisfactory                            condition to undergo the procedure.  After obtaining informed consent, the colonoscope                            was passed under direct vision. Throughout the                            procedure, the patient's blood pressure, pulse, and                            oxygen saturations were monitored continuously. The                            CF [id number] was introduced through the anus                            and advanced to the  the cecum, identified by                            appendiceal orifice and ileocecal valve. The                            colonoscopy was performed without difficulty. The                            patient tolerated the procedure well. The quality                            of the bowel preparation was good. The ileocecal                            valve, appendiceal orifice, and rectum were                            photographed. Scope In: 12:02:03 PM Scope Out: 12:25:51 PM Scope Withdrawal Time: 0 hours 17 minutes 57 seconds  Total Procedure Duration: 0 hours 23 minutes 48 seconds  Findings:                 The digital exam findings include surgically absent                            prostate. Pertinent negatives include no palpable                            rectal lesions.                           Two sessile polyps were found in the sigmoid colon                            and ascending colon. The polyps were diminutive in                            size. These polyps were removed with a cold snare.  Resection and retrieval were complete. Verification                            of patient identification for the specimen was                            done. Estimated blood loss was minimal.                           Multiple diverticula were found in the sigmoid                            colon.                           External hemorrhoids were found.                           Anal papilla(e) were hypertrophied.                           The exam was otherwise without abnormality on                            direct and retroflexion views. Complications:            No immediate complications. Estimated Blood Loss:     Estimated blood loss was minimal. Impression:               - A surgically absent prostate found on digital                            exam.                           - Two diminutive polyps in the sigmoid colon and in                             the ascending colon, removed with a cold snare.                            Resected and retrieved.                           - Diverticulosis in the sigmoid colon.                           - External hemorrhoids.                           - Anal papilla(e) were hypertrophied.                           - The examination was otherwise normal on direct                            and retroflexion views.                           -  Personal history of colonic polyps + CHEK                            mutation and grandfather had CRCA                           3 polyps removed 4/06, max size 12 mm and worst                            pathology                           tubulovillous adenoma                           2010 3 mm hyperplastic                           10/03/2015 7 polyps max 10 mm- 5 retrieved 2                            diminutive not 4-5 were adenomas on path, had                            post-polypectomy bleed                           10/22/2018 4 diminutive descending polyps removed -                            adenomas                           02/15/2022 11 diminutive polyps 10 adenomas and 1                            hyperplastic Recommendation:           - Patient has a contact number available for                            emergencies. The signs and symptoms of potential                            delayed complications were discussed with the                            patient. Return to normal activities tomorrow.                            Written discharge instructions were provided to the                            patient.                           - Resume previous diet.                           -  Continue present medications.                           - Repeat colonoscopy is recommended for                            surveillance. The colonoscopy date will be                            determined after pathology results from today's                             exam become available for review. Gatha Mayer, MD 03/19/2023 12:34:18 PM This report has been signed electronically.

## 2023-03-19 NOTE — Progress Notes (Unsigned)
[facility] History and Physical   Primary Care Physician:  Gaynelle Arabian, MD   Reason for Procedure:   Hx colon polyps, FHx CRCA + CHEK mutation  Plan:    colonoscopy     HPI: Jesse Perez is a 72 y.o. male w/ CHEK mutation and polyp removal for surveillance exam also w/ some rectal bleeding since last exam   3 polyps removed 4/06, max size 12 mm and worst pathology     tubulovillous adenoma 2010 3 mm hyperplastic 10/03/2015 7 polyps max 10 mm- 5 retrieved 2 diminutive not 4-5 were adenomas on path, had post-polypectomy bleed 10/22/2018 4 diminutive descending polyps removed - adenomas 02/15/2022 11 diminutive polyps 10 adenomas and 1 hyperplastic   Past Surgical History:  Procedure Laterality Date   APPENDECTOMY     1980s   COLONOSCOPY WITH PROPOFOL  02/15/2022   by dr Carlean Purl   COLONOSCOPY WITH PROPOFOL N/A 10/11/2015   Procedure: COLONOSCOPY WITH PROPOFOL;  Surgeon: Irene Shipper, MD;  Location: [facility];  Service: Endoscopy;  Laterality: N/A;   CYSTOSCOPY WITH URETHRAL DILATATION  11/13/2010   @[facility]  by dr Diona Fanti;   Dilatation urethral meatus and balloon dilatation bladder neck   EXCISION MELANOMA WITH SENTINEL LYMPH NODE BIOPSY  08/01/2021   @[facility]  by dr Harriet Masson;   WLE left lateral back w/ left axilla node dissection's   FISTULOTOMY N/A 04/09/2022   Procedure: FISTULOTOMY, ANAL BIOPSY;  Surgeon: Ileana Roup, MD;  Location: [place];  Service: General;  Laterality: N/A;   LUMBAR LAMINECTOMY  02/27/2000   @[facility]  by dr Carloyn Manner;;  L3  and L4   LUNG CANCER SURGERY Left    lung cancer   RECTAL EXAM UNDER ANESTHESIA N/A 04/09/2022   Procedure: ANORECTAL EXAM UNDER ANESTHESIA;  Surgeon: Ileana Roup, MD;  Location: [place];  Service: General;  Laterality: N/A;   ROBOT ASSISTED LAPAROSCOPIC RADICAL PROSTATECTOMY  06/30/2010   @[facility]     Prior to Admission medications   Medication Sig Start Date End  Date Taking? Authorizing Provider  acetaminophen (TYLENOL) 500 MG tablet Take 1,000 mg by mouth every 6 (six) hours as needed for moderate pain.   Yes [provider]  albuterol (VENTOLIN HFA) 108 (90 Base) MCG/ACT inhaler Inhale 2 puffs into the lungs every 6 (six) hours as needed for wheezing. 01/04/22  Yes [provider]  Azelastine HCl 137 MCG/SPRAY SOLN 2 sprays in each nostril Nasally *Twice a day 02/04/23  Yes [provider]  gabapentin (NEURONTIN) 300 MG capsule TAKE 1 CAPSULE BY MOUTH THREE TIMES A DAY. 01/31/17  Yes [provider]  losartan (COZAAR) 25 MG tablet Take 25 mg by mouth daily.   Yes [provider]  Semaglutide (OZEMPIC, 0.25 OR 0.5 MG/DOSE, [place]) Inject 0.5 mg into the skin every Wednesday.   Yes [provider]  calcium carbonate (TUMS - DOSED IN MG ELEMENTAL CALCIUM) 500 MG chewable tablet Chew 1 tablet by mouth daily as needed for indigestion or heartburn. Patient not taking: Reported on 03/19/2023    [provider]  desonide (DESOWEN) 0.05 % lotion Apply 1 Application topically daily as needed (rash). Patient not taking: Reported on 03/19/2023 11/16/21   [provider]  fluticasone (FLONASE) 50 MCG/ACT nasal spray Place 2 sprays into both nostrils at bedtime as needed for allergies. Patient not taking: Reported on 03/19/2023 01/22/22   [provider]  loratadine (CLARITIN) 10 MG tablet Take 10 mg by mouth daily as  needed for allergies.  Patient not taking: Reported on 03/19/2023    [provider]  rosuvastatin (CRESTOR) 5 MG tablet Take 5 mg by mouth daily. Patient not taking: Reported on 03/19/2023    [provider]    Current Outpatient Medications  Medication Sig Dispense Refill   acetaminophen (TYLENOL) 500 MG tablet Take 1,000 mg by mouth every 6 (six) hours as needed for moderate pain.     albuterol (VENTOLIN HFA) 108 (90 Base) MCG/ACT inhaler Inhale 2 puffs into the  lungs every 6 (six) hours as needed for wheezing.     Azelastine HCl 137 MCG/SPRAY SOLN 2 sprays in each nostril Nasally *Twice a day     gabapentin (NEURONTIN) 300 MG capsule TAKE 1 CAPSULE BY MOUTH THREE TIMES A DAY.     losartan (COZAAR) 25 MG tablet Take 25 mg by mouth daily.     Semaglutide (OZEMPIC, 0.25 OR 0.5 MG/DOSE, [place]) Inject 0.5 mg into the skin every Wednesday.     calcium carbonate (TUMS - DOSED IN MG ELEMENTAL CALCIUM) 500 MG chewable tablet Chew 1 tablet by mouth daily as needed for indigestion or heartburn. (Patient not taking: Reported on 03/19/2023)     desonide (DESOWEN) 0.05 % lotion Apply 1 Application topically daily as needed (rash). (Patient not taking: Reported on 03/19/2023)     fluticasone (FLONASE) 50 MCG/ACT nasal spray Place 2 sprays into both nostrils at bedtime as needed for allergies. (Patient not taking: Reported on 03/19/2023)     loratadine (CLARITIN) 10 MG tablet Take 10 mg by mouth daily as needed for allergies.  (Patient not taking: Reported on 03/19/2023)     rosuvastatin (CRESTOR) 5 MG tablet Take 5 mg by mouth daily. (Patient not taking: Reported on 03/19/2023)     Current Facility-Administered Medications  Medication Dose Route Frequency Provider Last Rate Last Admin   0.9 %  sodium chloride infusion  500 mL Intravenous Once Gatha Mayer, MD        Allergies as of 03/19/2023 - Review Complete 03/19/2023  Allergen Reaction Noted   Penicillins Anaphylaxis 11/09/2009   Zolpidem tartrate Anaphylaxis 08/27/2011   Metoprolol  11/22/2021    Family History  Problem Relation Age of Onset   Breast cancer Mother 51   Cancer Mother    Hypertension Father    Stroke Father    Breast cancer Sister 70       BRCA neg   Colon polyps Sister    Breast cancer Maternal Grandmother        dx in her 46s   Breast cancer Daughter 65       ATM and CHEK2 carrier   Hemochromatosis Daughter 90       C282Y and S65C double heterozygote   Colon cancer Paternal  Grandfather    Crohn's disease Neg Hx    Liver cancer Neg Hx    Pancreatic cancer Neg Hx    Rectal cancer Neg Hx    Stomach cancer Neg Hx    Ulcerative colitis Neg Hx    Esophageal cancer Neg Hx     Social History   Socioeconomic History   Marital status: Married    Spouse name: Not on file   Number of children: 3   Years of education: Not on file   Highest education level: Not on file  Occupational History    Employer: [employer]  Tobacco Use   Smoking status: Former    Years: 27    Types: Cigarettes  Quit date: 60    Years since quitting: 25.2   Smokeless tobacco: Never  Vaping Use   Vaping Use: Never used  Substance and Sexual Activity   Alcohol use: Yes    Comment: occasional   Drug use: Never   Sexual activity: Not on file  Other Topics Concern   Not on file  Social History Narrative   Not on file   Social Determinants of Health   Financial Resource Strain: Not on file  Food Insecurity: Not on file  Transportation Needs: Not on file  Physical Activity: Not on file  Stress: Not on file  Social Connections: Not on file  Intimate Partner Violence: Not on file    Review of Systems:  All other review of systems negative except as mentioned in the HPI.  Physical Exam: Vital signs BP 138/80   Pulse 60   Temp 98.6 F (37 C) (Skin)   Ht 5\' 10"  (1.778 m)   Wt 195 lb (88.5 kg)   SpO2 97%   BMI 27.98 kg/m   General:   Alert,  Well-developed, well-nourished, pleasant and cooperative in NAD Lungs:  Clear throughout to auscultation.   Heart:  Regular rate and rhythm; no murmurs, clicks, rubs,  or gallops. Abdomen:  Soft, nontender and nondistended. Normal bowel sounds.   Neuro/Psych:  Alert and cooperative. Normal mood and affect. A and O x 3   @Jadynn Epping  Simonne Maffucci, MD, [facility] [phone] (pager) 03/19/2023 11:54 AM@

## 2023-03-19 NOTE — Patient Instructions (Addendum)
Only 2 little polyps removed today. Bleeding is coming from hemorrhoids.  I will let you know pathology results and when to have another routine colonoscopy by mail and/or My Chart.  I appreciate the opportunity to care for you. Gatha Mayer, MD, FACG  YOU HAD AN ENDOSCOPIC PROCEDURE TODAY: Refer to the procedure report and other information in the discharge instructions given to you for any specific questions about what was found during the examination. If this information does not answer your questions, please call [place] office at [phone] to clarify.   YOU SHOULD EXPECT: Some feelings of bloating in the abdomen. Passage of more gas than usual. Walking can help get rid of the air that was put into your GI tract during the procedure and reduce the bloating. If you had a lower endoscopy (such as a colonoscopy or flexible sigmoidoscopy) you may notice spotting of blood in your stool or on the toilet paper. Some abdominal soreness may be present for a day or two, also.  DIET: Your first meal following the procedure should be a light meal and then it is ok to progress to your normal diet. A half-sandwich or bowl of soup is an example of a good first meal. Heavy or fried foods are harder to digest and may make you feel nauseous or bloated. Drink plenty of fluids but you should avoid alcoholic beverages for 24 hours. If you had a esophageal dilation, please see attached instructions for diet.    ACTIVITY: Your care partner should take you home directly after the procedure. You should plan to take it easy, moving slowly for the rest of the day. You can resume normal activity the day after the procedure however YOU SHOULD NOT DRIVE, use power tools, machinery or perform tasks that involve climbing or major physical exertion for 24 hours (because of the sedation medicines used during the test).   SYMPTOMS TO REPORT IMMEDIATELY: A gastroenterologist can be reached at any hour. Please call  [phone]  for any of the following symptoms:  Following lower endoscopy (colonoscopy, flexible sigmoidoscopy) Excessive amounts of blood in the stool  Significant tenderness, worsening of abdominal pains  Swelling of the abdomen that is new, acute  Fever of 100 or higher   FOLLOW UP:  If any biopsies were taken you will be contacted by phone or by letter within the next 1-3 weeks. Call [phone]  if you have not heard about the biopsies in 3 weeks.  Please also call with any specific questions about appointments or follow up tests.

## 2023-03-20 ENCOUNTER — Telehealth: Payer: Self-pay | Admitting: *Deleted

## 2023-03-20 NOTE — Telephone Encounter (Signed)
Left message on f/u call 

## 2023-03-22 DIAGNOSIS — R0981 Nasal congestion: Secondary | ICD-10-CM | POA: Diagnosis not present

## 2023-03-22 DIAGNOSIS — B351 Tinea unguium: Secondary | ICD-10-CM | POA: Diagnosis not present

## 2023-03-26 ENCOUNTER — Encounter: Payer: Self-pay | Admitting: Internal Medicine

## 2023-03-27 ENCOUNTER — Other Ambulatory Visit: Payer: Self-pay | Admitting: Internal Medicine

## 2023-03-27 DIAGNOSIS — K649 Unspecified hemorrhoids: Secondary | ICD-10-CM

## 2023-03-27 MED ORDER — HYDROCORTISONE (PERIANAL) 2.5 % EX CREA: 1.0000 | TOPICAL_CREAM | Freq: Two times a day (BID) | CUTANEOUS | 1 refills | Status: DC | PRN

## 2023-04-29 DIAGNOSIS — I129 Hypertensive chronic kidney disease with stage 1 through stage 4 chronic kidney disease, or unspecified chronic kidney disease: Secondary | ICD-10-CM | POA: Diagnosis not present

## 2023-04-29 DIAGNOSIS — N10 Acute tubulo-interstitial nephritis: Secondary | ICD-10-CM | POA: Diagnosis not present

## 2023-04-29 DIAGNOSIS — C3490 Malignant neoplasm of unspecified part of unspecified bronchus or lung: Secondary | ICD-10-CM | POA: Diagnosis not present

## 2023-04-29 DIAGNOSIS — N179 Acute kidney failure, unspecified: Secondary | ICD-10-CM | POA: Diagnosis not present

## 2023-04-29 DIAGNOSIS — E785 Hyperlipidemia, unspecified: Secondary | ICD-10-CM | POA: Diagnosis not present

## 2023-04-29 DIAGNOSIS — E1122 Type 2 diabetes mellitus with diabetic chronic kidney disease: Secondary | ICD-10-CM | POA: Diagnosis not present

## 2023-05-02 DIAGNOSIS — D2262 Melanocytic nevi of left upper limb, including shoulder: Secondary | ICD-10-CM | POA: Diagnosis not present

## 2023-05-02 DIAGNOSIS — D225 Melanocytic nevi of trunk: Secondary | ICD-10-CM | POA: Diagnosis not present

## 2023-05-02 DIAGNOSIS — D2261 Melanocytic nevi of right upper limb, including shoulder: Secondary | ICD-10-CM | POA: Diagnosis not present

## 2023-05-02 DIAGNOSIS — L905 Scar conditions and fibrosis of skin: Secondary | ICD-10-CM | POA: Diagnosis not present

## 2023-05-02 DIAGNOSIS — D2271 Melanocytic nevi of right lower limb, including hip: Secondary | ICD-10-CM | POA: Diagnosis not present

## 2023-05-02 DIAGNOSIS — Z85828 Personal history of other malignant neoplasm of skin: Secondary | ICD-10-CM | POA: Diagnosis not present

## 2023-05-02 DIAGNOSIS — L821 Other seborrheic keratosis: Secondary | ICD-10-CM | POA: Diagnosis not present

## 2023-05-02 DIAGNOSIS — D485 Neoplasm of uncertain behavior of skin: Secondary | ICD-10-CM | POA: Diagnosis not present

## 2023-05-02 DIAGNOSIS — D2272 Melanocytic nevi of left lower limb, including hip: Secondary | ICD-10-CM | POA: Diagnosis not present

## 2023-05-02 DIAGNOSIS — Z85820 Personal history of malignant melanoma of skin: Secondary | ICD-10-CM | POA: Diagnosis not present

## 2023-05-15 DIAGNOSIS — Z85820 Personal history of malignant melanoma of skin: Secondary | ICD-10-CM | POA: Diagnosis not present

## 2023-05-15 DIAGNOSIS — D485 Neoplasm of uncertain behavior of skin: Secondary | ICD-10-CM | POA: Diagnosis not present

## 2023-06-06 DIAGNOSIS — Z8546 Personal history of malignant neoplasm of prostate: Secondary | ICD-10-CM | POA: Diagnosis not present

## 2023-06-06 DIAGNOSIS — I712 Thoracic aortic aneurysm, without rupture, unspecified: Secondary | ICD-10-CM | POA: Diagnosis not present

## 2023-06-06 DIAGNOSIS — Z86010 Personal history of colonic polyps: Secondary | ICD-10-CM | POA: Diagnosis not present

## 2023-06-06 DIAGNOSIS — E119 Type 2 diabetes mellitus without complications: Secondary | ICD-10-CM | POA: Diagnosis not present

## 2023-06-06 DIAGNOSIS — K2100 Gastro-esophageal reflux disease with esophagitis, without bleeding: Secondary | ICD-10-CM | POA: Diagnosis not present

## 2023-06-06 DIAGNOSIS — I10 Essential (primary) hypertension: Secondary | ICD-10-CM | POA: Diagnosis not present

## 2023-06-06 DIAGNOSIS — E785 Hyperlipidemia, unspecified: Secondary | ICD-10-CM | POA: Diagnosis not present

## 2023-06-06 DIAGNOSIS — Z85118 Personal history of other malignant neoplasm of bronchus and lung: Secondary | ICD-10-CM | POA: Diagnosis not present

## 2023-06-06 DIAGNOSIS — E1169 Type 2 diabetes mellitus with other specified complication: Secondary | ICD-10-CM | POA: Diagnosis not present

## 2023-06-06 DIAGNOSIS — Z79899 Other long term (current) drug therapy: Secondary | ICD-10-CM | POA: Diagnosis not present

## 2023-06-06 DIAGNOSIS — I700 Atherosclerosis of aorta: Secondary | ICD-10-CM | POA: Diagnosis not present

## 2023-06-06 DIAGNOSIS — Z85820 Personal history of malignant melanoma of skin: Secondary | ICD-10-CM | POA: Diagnosis not present

## 2023-06-14 DIAGNOSIS — D485 Neoplasm of uncertain behavior of skin: Secondary | ICD-10-CM | POA: Diagnosis not present

## 2023-06-18 DIAGNOSIS — N059 Unspecified nephritic syndrome with unspecified morphologic changes: Secondary | ICD-10-CM | POA: Diagnosis not present

## 2023-06-18 DIAGNOSIS — E1142 Type 2 diabetes mellitus with diabetic polyneuropathy: Secondary | ICD-10-CM | POA: Diagnosis not present

## 2023-06-18 DIAGNOSIS — Z483 Aftercare following surgery for neoplasm: Secondary | ICD-10-CM | POA: Diagnosis not present

## 2023-06-18 DIAGNOSIS — C3412 Malignant neoplasm of upper lobe, left bronchus or lung: Secondary | ICD-10-CM | POA: Diagnosis not present

## 2023-06-18 DIAGNOSIS — Z09 Encounter for follow-up examination after completed treatment for conditions other than malignant neoplasm: Secondary | ICD-10-CM | POA: Diagnosis not present

## 2023-06-18 DIAGNOSIS — J984 Other disorders of lung: Secondary | ICD-10-CM | POA: Diagnosis not present

## 2023-06-18 DIAGNOSIS — Z7985 Long-term (current) use of injectable non-insulin antidiabetic drugs: Secondary | ICD-10-CM | POA: Diagnosis not present

## 2023-06-18 DIAGNOSIS — I4891 Unspecified atrial fibrillation: Secondary | ICD-10-CM | POA: Diagnosis not present

## 2023-06-18 DIAGNOSIS — Z86010 Personal history of colonic polyps: Secondary | ICD-10-CM | POA: Diagnosis not present

## 2023-06-18 DIAGNOSIS — C3492 Malignant neoplasm of unspecified part of left bronchus or lung: Secondary | ICD-10-CM | POA: Diagnosis not present

## 2023-06-18 DIAGNOSIS — Z803 Family history of malignant neoplasm of breast: Secondary | ICD-10-CM | POA: Diagnosis not present

## 2023-06-18 DIAGNOSIS — N62 Hypertrophy of breast: Secondary | ICD-10-CM | POA: Diagnosis not present

## 2023-06-18 DIAGNOSIS — C3490 Malignant neoplasm of unspecified part of unspecified bronchus or lung: Secondary | ICD-10-CM | POA: Diagnosis not present

## 2023-06-18 DIAGNOSIS — C4359 Malignant melanoma of other part of trunk: Secondary | ICD-10-CM | POA: Diagnosis not present

## 2023-06-18 DIAGNOSIS — C439 Malignant melanoma of skin, unspecified: Secondary | ICD-10-CM | POA: Diagnosis not present

## 2023-06-18 DIAGNOSIS — Z79899 Other long term (current) drug therapy: Secondary | ICD-10-CM | POA: Diagnosis not present

## 2023-06-18 DIAGNOSIS — Z9221 Personal history of antineoplastic chemotherapy: Secondary | ICD-10-CM | POA: Diagnosis not present

## 2023-06-18 DIAGNOSIS — K449 Diaphragmatic hernia without obstruction or gangrene: Secondary | ICD-10-CM | POA: Diagnosis not present

## 2023-07-24 ENCOUNTER — Ambulatory Visit: Payer: Medicare HMO | Admitting: Podiatry

## 2023-07-24 ENCOUNTER — Encounter: Payer: Self-pay | Admitting: Podiatry

## 2023-07-24 VITALS — BP 135/74 | HR 69 | Ht 70.0 in

## 2023-07-24 DIAGNOSIS — T451X5A Adverse effect of antineoplastic and immunosuppressive drugs, initial encounter: Secondary | ICD-10-CM

## 2023-07-24 DIAGNOSIS — G620 Drug-induced polyneuropathy: Secondary | ICD-10-CM

## 2023-07-24 DIAGNOSIS — M21371 Foot drop, right foot: Secondary | ICD-10-CM | POA: Diagnosis not present

## 2023-07-24 NOTE — Progress Notes (Signed)
No chief complaint on file.  HPI: 72 y.o. male presenting today as a new patient for evaluation of slight discoloration to the bilateral toenails that began a few months ago.  Denies a history of injury.  He does have a history of foot drop to the right lower extremity since 2001 secondary to back surgery.  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  Currently takes gabapentin.  Presenting for further treatment and evaluation  Past Medical History:  Diagnosis Date   AAA (abdominal aortic aneurysm) (HCC)    followed by pcp;   last CTA in epic 06-17-2020 , 4.0cm   Allergy    seasonal   Anal fistula    Asthma    Bradycardia    DDD (degenerative disc disease), lumbar    Family history of colon cancer - grandfather and CHEK mutation increasing colon cancer risk 10/03/2015   GERD (gastroesophageal reflux disease)    Hemochromatosis carrier    Hemorrhoids    History of adenomatous polyp of colon    followed by dr Leone Payor   History of asthma    child   History of atrial fibrillation    remote hx episdoe atrial fib   History of COVID-19 05/2021   per pt mild sympomts that resolved   History of lower GI bleeding 10/2015   post multiple colonscopy polypectomy's   History of prostate cancer 2011   urologist--- dr Retta Diones;  primary prostate cancer;  06-30-2010  s/p radial prostatectomy w/ node dissection's @[facility] ,  no recurrence per pt   Hyperlipidemia    Hypertension    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 collichio;  dx 06/ 2022;   08-01-2021  s/p WLE left lateral back w/ left axilla node dissection's (positive nodes), Stage III;  chemo started 10-02-2021 q28d   Nocturia    Nodule of left lung 2018   followed by oncology--- [facility]   Obstructive sleep apnea    does not use CPAP, intolerent;  mild osa per sleep study in epic 10-02-2011   Port-A-Cath in place    PVC's (premature  ventricular contractions)    cardiologist-- dr g. taylor for remote hx atrial fib,  symptomatic PVCs bigeminy  (04-02-2022  per pt no longer is symptomatic with irregular heart beat)   Type 2 diabetes mellitus (HCC)    followed by pcp   (04-02-2022  per pt does not check blood sugar at home)   Wears contact lenses    Wears hearing aid in both ears     Past Surgical History:  Procedure Laterality Date   APPENDECTOMY     1980s   COLONOSCOPY WITH PROPOFOL  02/15/2022   by dr Leone Payor   COLONOSCOPY WITH PROPOFOL N/A 10/11/2015   Procedure: COLONOSCOPY WITH PROPOFOL;  Surgeon: Hilarie Fredrickson, MD;  Location: [facility];  Service: Endoscopy;  Laterality: N/A;   CYSTOSCOPY WITH URETHRAL DILATATION  11/13/2010   @[facility]  by dr Retta Diones;   Dilatation urethral meatus and balloon dilatation bladder neck   EXCISION MELANOMA WITH SENTINEL LYMPH NODE BIOPSY  08/01/2021   @[facility]  by dr Dan Europe;   WLE left lateral back w/ left axilla node dissection's   FISTULOTOMY N/A 04/09/2022   Procedure: FISTULOTOMY, ANAL BIOPSY;  Surgeon: Andria Meuse, MD;  Location: [facility];  Service: General;  Laterality: N/A;   LUMBAR LAMINECTOMY  02/27/2000   @[facility]  by dr Channing Mutters;;  L3  and L4   LUNG CANCER SURGERY Left    lung cancer   RECTAL EXAM UNDER ANESTHESIA N/A 04/09/2022   Procedure: ANORECTAL EXAM UNDER ANESTHESIA;  Surgeon: Andria Meuse, MD;  Location: [facility];  Service: General;  Laterality: N/A;   ROBOT ASSISTED LAPAROSCOPIC RADICAL PROSTATECTOMY  06/30/2010   @[facility]     Allergies  Allergen Reactions   Penicillins Anaphylaxis    Has patient had a PCN reaction causing immediate rash, facial/tongue/throat swelling, SOB or lightheadedness with hypotension: Yes Has patient had a PCN reaction causing severe rash involving mucus membranes or skin necrosis: No Has patient had a PCN reaction that required hospitalization Yes Has patient had a PCN reaction occurring within  the last 10 years: No If all of the above answers are "NO", then may proceed with Cephalosporin use.     Zolpidem Tartrate Anaphylaxis   Metoprolol     fatigue (2021)     Physical Exam: General: The patient is alert and oriented x3 in no acute distress.  Dermatology: Skin is warm, dry and supple bilateral lower extremities.  Transverse line noted across the base of the nail plate of the bilateral great toes demonstrating some sort of disruption of growth likely secondary to microtrauma  Vascular: Palpable pedal pulses bilaterally. Capillary refill within normal limits.  No appreciable edema.  No erythema.  Neurological: Light touch and protective threshold diminished  Musculoskeletal Exam: Foot drop RLE secondary to back surgery 2001  Assessment/Plan of Care: 1.  Disruption of the nail plate growth bilateral great toes; approximately 2-3 months old 2. H/o foot drop RLE secondary to lumbar pathology 2001 3.  Peripheral polyneuropathy secondary to chemotherapy about 2 years ago  -Patient evaluated -Continue AFO -Explained to the patient that the toenails to the bilateral great toes do not appear to be fungal related.  There is a transverse line across the base of the nail plates bilaterally demonstrating a disruption of growth likely secondary to microtrauma.  Explained to the patient that this should grow out.  It appears that new nail growth is growing underneath an old nail plate -Recommend good supportive shoes that do not irritate or constrict the toebox area -Continue gabapentin as needed as prescribed. -Recommend vitamin B supplements available OTC -Return to clinic as needed    Felecia Shelling, DPM [facility]  Dr. Felecia Shelling, DPM    [address] [place], [place] [address]                Office [phone]  Fax [phone]

## 2023-10-28 DIAGNOSIS — N179 Acute kidney failure, unspecified: Secondary | ICD-10-CM | POA: Diagnosis not present

## 2023-10-28 DIAGNOSIS — N2581 Secondary hyperparathyroidism of renal origin: Secondary | ICD-10-CM | POA: Diagnosis not present

## 2023-10-28 DIAGNOSIS — E1122 Type 2 diabetes mellitus with diabetic chronic kidney disease: Secondary | ICD-10-CM | POA: Diagnosis not present

## 2023-10-28 DIAGNOSIS — N10 Acute tubulo-interstitial nephritis: Secondary | ICD-10-CM | POA: Diagnosis not present

## 2023-10-28 DIAGNOSIS — D631 Anemia in chronic kidney disease: Secondary | ICD-10-CM | POA: Diagnosis not present

## 2023-10-28 DIAGNOSIS — I129 Hypertensive chronic kidney disease with stage 1 through stage 4 chronic kidney disease, or unspecified chronic kidney disease: Secondary | ICD-10-CM | POA: Diagnosis not present

## 2023-10-28 DIAGNOSIS — N183 Chronic kidney disease, stage 3 unspecified: Secondary | ICD-10-CM | POA: Diagnosis not present

## 2023-11-05 DIAGNOSIS — D225 Melanocytic nevi of trunk: Secondary | ICD-10-CM | POA: Diagnosis not present

## 2023-11-05 DIAGNOSIS — L821 Other seborrheic keratosis: Secondary | ICD-10-CM | POA: Diagnosis not present

## 2023-11-05 DIAGNOSIS — L905 Scar conditions and fibrosis of skin: Secondary | ICD-10-CM | POA: Diagnosis not present

## 2023-11-05 DIAGNOSIS — D2262 Melanocytic nevi of left upper limb, including shoulder: Secondary | ICD-10-CM | POA: Diagnosis not present

## 2023-11-05 DIAGNOSIS — D485 Neoplasm of uncertain behavior of skin: Secondary | ICD-10-CM | POA: Diagnosis not present

## 2023-11-05 DIAGNOSIS — Z85828 Personal history of other malignant neoplasm of skin: Secondary | ICD-10-CM | POA: Diagnosis not present

## 2023-11-05 DIAGNOSIS — D2261 Melanocytic nevi of right upper limb, including shoulder: Secondary | ICD-10-CM | POA: Diagnosis not present

## 2023-11-05 DIAGNOSIS — D2271 Melanocytic nevi of right lower limb, including hip: Secondary | ICD-10-CM | POA: Diagnosis not present

## 2023-11-05 DIAGNOSIS — D3617 Benign neoplasm of peripheral nerves and autonomic nervous system of trunk, unspecified: Secondary | ICD-10-CM | POA: Diagnosis not present

## 2023-11-05 DIAGNOSIS — Z85820 Personal history of malignant melanoma of skin: Secondary | ICD-10-CM | POA: Diagnosis not present

## 2023-11-05 DIAGNOSIS — L814 Other melanin hyperpigmentation: Secondary | ICD-10-CM | POA: Diagnosis not present

## 2023-11-05 DIAGNOSIS — D2272 Melanocytic nevi of left lower limb, including hip: Secondary | ICD-10-CM | POA: Diagnosis not present

## 2023-12-17 DIAGNOSIS — Z7985 Long-term (current) use of injectable non-insulin antidiabetic drugs: Secondary | ICD-10-CM | POA: Diagnosis not present

## 2023-12-17 DIAGNOSIS — I10 Essential (primary) hypertension: Secondary | ICD-10-CM | POA: Diagnosis not present

## 2023-12-17 DIAGNOSIS — J329 Chronic sinusitis, unspecified: Secondary | ICD-10-CM | POA: Diagnosis not present

## 2023-12-17 DIAGNOSIS — C4359 Malignant melanoma of other part of trunk: Secondary | ICD-10-CM | POA: Diagnosis not present

## 2023-12-17 DIAGNOSIS — C3492 Malignant neoplasm of unspecified part of left bronchus or lung: Secondary | ICD-10-CM | POA: Diagnosis not present

## 2023-12-17 DIAGNOSIS — Z79899 Other long term (current) drug therapy: Secondary | ICD-10-CM | POA: Diagnosis not present

## 2023-12-17 DIAGNOSIS — Z803 Family history of malignant neoplasm of breast: Secondary | ICD-10-CM | POA: Diagnosis not present

## 2023-12-17 DIAGNOSIS — C3412 Malignant neoplasm of upper lobe, left bronchus or lung: Secondary | ICD-10-CM | POA: Diagnosis not present

## 2023-12-17 DIAGNOSIS — E1142 Type 2 diabetes mellitus with diabetic polyneuropathy: Secondary | ICD-10-CM | POA: Diagnosis not present

## 2023-12-17 DIAGNOSIS — C439 Malignant melanoma of skin, unspecified: Secondary | ICD-10-CM | POA: Diagnosis not present

## 2023-12-18 DIAGNOSIS — I10 Essential (primary) hypertension: Secondary | ICD-10-CM | POA: Diagnosis not present

## 2023-12-18 DIAGNOSIS — H6690 Otitis media, unspecified, unspecified ear: Secondary | ICD-10-CM | POA: Diagnosis not present

## 2023-12-18 DIAGNOSIS — R062 Wheezing: Secondary | ICD-10-CM | POA: Diagnosis not present

## 2023-12-18 DIAGNOSIS — E782 Mixed hyperlipidemia: Secondary | ICD-10-CM | POA: Diagnosis not present

## 2023-12-18 DIAGNOSIS — E1140 Type 2 diabetes mellitus with diabetic neuropathy, unspecified: Secondary | ICD-10-CM | POA: Diagnosis not present

## 2023-12-18 DIAGNOSIS — G629 Polyneuropathy, unspecified: Secondary | ICD-10-CM | POA: Diagnosis not present

## 2023-12-18 DIAGNOSIS — J989 Respiratory disorder, unspecified: Secondary | ICD-10-CM | POA: Diagnosis not present

## 2023-12-18 DIAGNOSIS — G620 Drug-induced polyneuropathy: Secondary | ICD-10-CM | POA: Diagnosis not present

## 2024-01-08 DIAGNOSIS — Z85820 Personal history of malignant melanoma of skin: Secondary | ICD-10-CM | POA: Diagnosis not present

## 2024-01-08 DIAGNOSIS — D225 Melanocytic nevi of trunk: Secondary | ICD-10-CM | POA: Diagnosis not present

## 2024-01-08 DIAGNOSIS — D485 Neoplasm of uncertain behavior of skin: Secondary | ICD-10-CM | POA: Diagnosis not present

## 2024-01-16 DIAGNOSIS — H25013 Cortical age-related cataract, bilateral: Secondary | ICD-10-CM | POA: Diagnosis not present

## 2024-01-16 DIAGNOSIS — E119 Type 2 diabetes mellitus without complications: Secondary | ICD-10-CM | POA: Diagnosis not present

## 2024-01-16 DIAGNOSIS — H0288A Meibomian gland dysfunction right eye, upper and lower eyelids: Secondary | ICD-10-CM | POA: Diagnosis not present

## 2024-01-16 DIAGNOSIS — H0288B Meibomian gland dysfunction left eye, upper and lower eyelids: Secondary | ICD-10-CM | POA: Diagnosis not present

## 2024-02-06 DIAGNOSIS — T783XXA Angioneurotic edema, initial encounter: Secondary | ICD-10-CM | POA: Diagnosis not present

## 2024-02-14 ENCOUNTER — Other Ambulatory Visit: Payer: Self-pay | Admitting: General Surgery

## 2024-02-14 DIAGNOSIS — R222 Localized swelling, mass and lump, trunk: Secondary | ICD-10-CM | POA: Diagnosis not present

## 2024-02-19 ENCOUNTER — Encounter: Payer: Self-pay | Admitting: Genetic Counselor

## 2024-02-24 ENCOUNTER — Encounter: Payer: Self-pay | Admitting: General Surgery

## 2024-03-11 ENCOUNTER — Ambulatory Visit
Admission: RE | Admit: 2024-03-11 | Discharge: 2024-03-11 | Disposition: A | Discharge status: Home / Self Care | Payer: Medicare HMO | Source: Ambulatory Visit | Attending: General Surgery | Admitting: General Surgery

## 2024-03-11 DIAGNOSIS — R222 Localized swelling, mass and lump, trunk: Secondary | ICD-10-CM | POA: Diagnosis not present

## 2024-03-11 MED ORDER — GADOPICLENOL 0.5 MMOL/ML IV SOLN
10.0000 mL | Freq: Once | INTRAVENOUS | Status: AC | PRN
  Administered 2024-03-11: 10 mL via INTRAVENOUS

## 2024-03-18 ENCOUNTER — Other Ambulatory Visit: Payer: Self-pay | Admitting: General Surgery

## 2024-04-22 ENCOUNTER — Other Ambulatory Visit: Payer: Self-pay

## 2024-04-22 ENCOUNTER — Encounter (HOSPITAL_BASED_OUTPATIENT_CLINIC_OR_DEPARTMENT_OTHER): Payer: Self-pay | Admitting: General Surgery

## 2024-04-28 ENCOUNTER — Encounter (HOSPITAL_BASED_OUTPATIENT_CLINIC_OR_DEPARTMENT_OTHER)
Admission: RE | Admit: 2024-04-28 | Discharge: 2024-04-28 | Disposition: A | Discharge status: Home / Self Care | Source: Ambulatory Visit | Attending: General Surgery | Admitting: General Surgery

## 2024-04-28 DIAGNOSIS — Z01812 Encounter for preprocedural laboratory examination: Secondary | ICD-10-CM | POA: Insufficient documentation

## 2024-04-28 LAB — BASIC METABOLIC PANEL WITH GFR
Anion gap: 7 (ref 5–15)
BUN: 24 mg/dL — ABNORMAL HIGH (ref 8–23)
CO2: 23 mmol/L (ref 22–32)
Calcium: 8.6 mg/dL — ABNORMAL LOW (ref 8.9–10.3)
Chloride: 108 mmol/L (ref 98–111)
Creatinine, Ser: 1.33 mg/dL — ABNORMAL HIGH (ref 0.61–1.24)
GFR, Estimated: 58 mL/min — ABNORMAL LOW (ref >60)
Glucose, Bld: 115 mg/dL — ABNORMAL HIGH (ref 70–99)
Potassium: 5.2 mmol/L — ABNORMAL HIGH (ref 3.5–5.1)
Sodium: 138 mmol/L (ref 135–145)

## 2024-04-28 MED ORDER — CHLORHEXIDINE GLUCONATE CLOTH 2 % EX PADS: 6.0000 | MEDICATED_PAD | Freq: Once | CUTANEOUS | Status: DC

## 2024-04-28 MED ORDER — ENSURE PRE-SURGERY PO LIQD: 296.0000 mL | Freq: Once | ORAL | Status: DC

## 2024-04-28 NOTE — Progress Notes (Signed)

## 2024-04-28 NOTE — Progress Notes (Signed)
 K+ 5.2, will retest BMP in AM per Dr. Annabell Key.

## 2024-04-28 NOTE — H&P (Signed)
 73 year old male with a history of diabetes, lung cancer, melanoma who presents with a right medial chest wall mass. This area has been excised 2 or 3 times. 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 Margins were free on the last excision. However this area is recurred and is quite sizable now.  Review of Systems: A complete review of systems was obtained from the patient. I have reviewed this information and discussed as appropriate with the patient. See HPI as well for other ROS.  Review of Systems  All other systems reviewed and are negative.   Medical History: Past Medical History:  Diagnosis Date  Diabetes mellitus without complication (CMS/HHS-HCC)  GERD (gastroesophageal reflux disease)  History of cancer   There is no problem list on file for this patient.  Past Surgical History:  Procedure Laterality Date  APPENDECTOMY  melanoma  05/2021; on back  PROSTATE SURGERY  SPINE SURGERY    Allergies  Allergen Reactions  Penicillins Anaphylaxis  Has patient had a PCN reaction causing immediate rash, facial/tongue/throat swelling, SOB or lightheadedness with hypotension: Yes Has patient had a PCN reaction causing severe rash involving mucus membranes or skin necrosis: No Has patient had a PCN reaction that required hospitalization Yes Has patient had a PCN reaction occurring within the last 10 years: No If all of the above answers are "NO", then may proceed with Cephalosporin use. Has patient had a PCN reaction causing immediate rash, facial/tongue/throat swelling, SOB or lightheadedness with hypotension: Yes Has patient had a PCN reaction causing severe rash involving mucus membranes or skin necrosis: No Has patient had a PCN reaction that required hospitalization Yes Has patient had a PCN reaction occurring within the last 10 years: No If all of the above answers are "NO", then may proceed with Cephalosporin use. Has patient had a PCN reaction causing immediate rash,  facial/tongue/throat swelling, SOB or lightheadedness with hypotension: Yes Has patient had a PCN reaction that required hospitalization Yes   Current Outpatient Medications on File Prior to Visit  Medication Sig Dispense Refill  albuterol 90 mcg/actuation inhaler 2 inhalations every 6 (six) hours as needed  desonide (DESOWEN) 0.05 % lotion Apply topically  fluticasone propionate (FLONASE) 50 mcg/actuation nasal spray USE 2 SPRAYS IN AFFECTED NOSTRIL AT BEDTIME  gabapentin (NEURONTIN) 100 MG capsule Take 100 mg by mouth at bedtime  ibuprofen (MOTRIN) 200 MG tablet Take by mouth every 6 (six) hours as needed  loratadine (CLARITIN) 10 mg tablet Take by mouth at bedtime as needed  losartan -hydrochlorothiazide (HYZAAR) 50-12.5 mg tablet TAKE 1 TABLET ORALLY ONCE A DAY FOR BLOOD PRESSURE  omeprazole (PRILOSEC) 40 MG DR capsule Take by mouth  rosuvastatin (CRESTOR) 5 MG tablet Take 1 tablet by mouth every morning  semaglutide (OZEMPIC) 0.25 mg or 0.5 mg (2 mg/3 mL) pen injector  amLODIPine  (NORVASC ) 5 MG tablet Take by mouth  metFORMIN (GLUCOPHAGE-XR) 500 MG XR tablet Take by mouth  naproxen sodium 220 mg Cap Take by mouth at bedtime as needed   No current facility-administered medications on file prior to visit.   Family History  Problem Relation Age of Onset  Breast cancer Mother  Stroke Father  Coronary Artery Disease (Blocked arteries around heart) Father  Deep vein thrombosis (DVT or abnormal blood clot formation) Father  High blood pressure (Hypertension) Sister  Breast cancer Sister    Social History   Tobacco Use  Smoking Status Former  Types: Cigarettes  Smokeless Tobacco Never  Marital status: Married  Tobacco Use  Smoking status:  Former  Types: Cigarettes  Smokeless tobacco: Never  Vaping Use  Vaping status: Never Used  Substance and Sexual Activity  Alcohol use: Yes  Drug use: Never   Objective:  Weight: 93.7 kg (206 lb 9.6 oz)  Height: 177.8 cm (5\' 10" )   PainSc: 0-No pain  PainLoc: Chest   Body mass index is 29.64 kg/m.  Physical Exam Vitals reviewed.  Constitutional:  Appearance: Normal appearance.  Chest: Comments: 4-5 cm mobile medial right upper cw mass Neurological:  Mental Status: He is alert.     Assessment and Plan:   Chest wall mass - MRI chest with and without contrast-this shows a 4.3x2x3.2 cm mass not extending from subcutaneous fat   I think with the fact that this is recurred at least a couple of times it is reasonable to send him for some imaging first to get the extent of this lesion. I will send him to get an MRI. Once we do this we discussed a wide local excision under anesthesia.

## 2024-04-29 ENCOUNTER — Ambulatory Visit (HOSPITAL_BASED_OUTPATIENT_CLINIC_OR_DEPARTMENT_OTHER): Payer: Self-pay | Admitting: Certified Registered"

## 2024-04-29 ENCOUNTER — Encounter (HOSPITAL_BASED_OUTPATIENT_CLINIC_OR_DEPARTMENT_OTHER): Payer: Self-pay | Admitting: General Surgery

## 2024-04-29 ENCOUNTER — Encounter (HOSPITAL_BASED_OUTPATIENT_CLINIC_OR_DEPARTMENT_OTHER)
Admission: RE | Disposition: A | Discharge status: Home / Self Care | Payer: Self-pay | Source: Home / Self Care | Attending: General Surgery

## 2024-04-29 ENCOUNTER — Ambulatory Visit (HOSPITAL_BASED_OUTPATIENT_CLINIC_OR_DEPARTMENT_OTHER)
Admission: RE | Admit: 2024-04-29 | Discharge: 2024-04-29 | Disposition: A | Discharge status: Home / Self Care | Attending: General Surgery | Admitting: General Surgery

## 2024-04-29 ENCOUNTER — Other Ambulatory Visit: Payer: Self-pay

## 2024-04-29 DIAGNOSIS — D3614 Benign neoplasm of peripheral nerves and autonomic nervous system of thorax: Secondary | ICD-10-CM | POA: Insufficient documentation

## 2024-04-29 DIAGNOSIS — I10 Essential (primary) hypertension: Secondary | ICD-10-CM

## 2024-04-29 DIAGNOSIS — Z7984 Long term (current) use of oral hypoglycemic drugs: Secondary | ICD-10-CM | POA: Insufficient documentation

## 2024-04-29 DIAGNOSIS — Z85118 Personal history of other malignant neoplasm of bronchus and lung: Secondary | ICD-10-CM | POA: Diagnosis not present

## 2024-04-29 DIAGNOSIS — E119 Type 2 diabetes mellitus without complications: Secondary | ICD-10-CM

## 2024-04-29 DIAGNOSIS — J45909 Unspecified asthma, uncomplicated: Secondary | ICD-10-CM

## 2024-04-29 DIAGNOSIS — Z85820 Personal history of malignant melanoma of skin: Secondary | ICD-10-CM | POA: Insufficient documentation

## 2024-04-29 DIAGNOSIS — I4891 Unspecified atrial fibrillation: Secondary | ICD-10-CM | POA: Insufficient documentation

## 2024-04-29 DIAGNOSIS — G4730 Sleep apnea, unspecified: Secondary | ICD-10-CM | POA: Insufficient documentation

## 2024-04-29 DIAGNOSIS — Z87891 Personal history of nicotine dependence: Secondary | ICD-10-CM | POA: Diagnosis not present

## 2024-04-29 DIAGNOSIS — R222 Localized swelling, mass and lump, trunk: Secondary | ICD-10-CM | POA: Diagnosis not present

## 2024-04-29 DIAGNOSIS — Z01818 Encounter for other preprocedural examination: Secondary | ICD-10-CM

## 2024-04-29 HISTORY — DX: Myoneural disorder, unspecified: G70.9

## 2024-04-29 HISTORY — DX: Malignant neoplasm of unspecified part of unspecified bronchus or lung: C34.90

## 2024-04-29 LAB — BASIC METABOLIC PANEL WITH GFR
Anion gap: 8 (ref 5–15)
BUN: 20 mg/dL (ref 8–23)
CO2: 24 mmol/L (ref 22–32)
Calcium: 8.7 mg/dL — ABNORMAL LOW (ref 8.9–10.3)
Chloride: 107 mmol/L (ref 98–111)
Creatinine, Ser: 1.25 mg/dL — ABNORMAL HIGH (ref 0.61–1.24)
GFR, Estimated: >60 mL/min (ref >60)
Glucose, Bld: 100 mg/dL — ABNORMAL HIGH (ref 70–99)
Potassium: 4 mmol/L (ref 3.5–5.1)
Sodium: 139 mmol/L (ref 135–145)

## 2024-04-29 LAB — GLUCOSE, CAPILLARY
Glucose-Capillary: 95 mg/dL (ref 70–99)
Glucose-Capillary: 115 mg/dL — ABNORMAL HIGH (ref 70–99)

## 2024-04-29 SURGERY — EXCISION, MASS, CHEST WALL
Anesthesia: General | Laterality: Right

## 2024-04-29 MED ORDER — OXYCODONE HCL 5 MG/5ML PO SOLN: 5.0000 mg | Freq: Once | ORAL | Status: DC | PRN

## 2024-04-29 MED ORDER — ACETAMINOPHEN 500 MG PO TABS
1000.0000 mg | ORAL_TABLET | ORAL | Status: AC
  Administered 2024-04-29: 1000 mg via ORAL

## 2024-04-29 MED ORDER — 0.9 % SODIUM CHLORIDE (POUR BTL) OPTIME
TOPICAL | Status: DC | PRN
  Administered 2024-04-29: 1000 mL

## 2024-04-29 MED ORDER — DEXAMETHASONE SODIUM PHOSPHATE 10 MG/ML IJ SOLN
INTRAMUSCULAR | Status: DC | PRN
  Administered 2024-04-29: 5 mg via INTRAVENOUS

## 2024-04-29 MED ORDER — AMISULPRIDE (ANTIEMETIC) 5 MG/2ML IV SOLN: 10.0000 mg | Freq: Once | INTRAVENOUS | Status: DC | PRN

## 2024-04-29 MED ORDER — ONDANSETRON HCL 4 MG/2ML IJ SOLN
INTRAMUSCULAR | Status: DC | PRN
  Administered 2024-04-29: 4 mg via INTRAVENOUS

## 2024-04-29 MED ORDER — HYDROMORPHONE HCL 1 MG/ML IJ SOLN
0.2500 mg | INTRAMUSCULAR | Status: DC | PRN
Start: 2024-04-29 — End: 2024-04-29

## 2024-04-29 MED ORDER — SODIUM CHLORIDE 0.9 % IV SOLN
12.5000 mg | INTRAVENOUS | Status: DC | PRN
  Filled 2024-04-29: qty 0.5

## 2024-04-29 MED ORDER — BACITRACIN-NEOMYCIN-POLYMYXIN OINTMENT TUBE
TOPICAL_OINTMENT | CUTANEOUS | Status: AC
  Filled 2024-04-29: qty 14.17

## 2024-04-29 MED ORDER — LIDOCAINE HCL (CARDIAC) PF 100 MG/5ML IV SOSY
PREFILLED_SYRINGE | INTRAVENOUS | Status: DC | PRN
  Administered 2024-04-29: 60 mg via INTRAVENOUS

## 2024-04-29 MED ORDER — LIDOCAINE HCL 1 % IJ SOLN
INTRAMUSCULAR | Status: DC | PRN
  Administered 2024-04-29: 10 mL

## 2024-04-29 MED ORDER — TRAMADOL HCL 50 MG PO TABS: 50.0000 mg | ORAL_TABLET | Freq: Four times a day (QID) | ORAL | 0 refills | Status: AC | PRN

## 2024-04-29 MED ORDER — PROPOFOL 10 MG/ML IV BOLUS
INTRAVENOUS | Status: DC | PRN
  Administered 2024-04-29: 200 mg via INTRAVENOUS

## 2024-04-29 MED ORDER — OXYCODONE HCL 5 MG PO TABS: 5.0000 mg | ORAL_TABLET | Freq: Once | ORAL | Status: DC | PRN

## 2024-04-29 MED ORDER — TRIPLE ANTIBIOTIC 3.5-400-5000 EX OINT
TOPICAL_OINTMENT | CUTANEOUS | Status: DC | PRN
  Administered 2024-04-29: 1

## 2024-04-29 MED ORDER — CIPROFLOXACIN IN D5W 400 MG/200ML IV SOLN
400.0000 mg | INTRAVENOUS | Status: AC
  Administered 2024-04-29: 400 mg via INTRAVENOUS

## 2024-04-29 MED ORDER — LACTATED RINGERS IV SOLN: INTRAVENOUS | Status: DC

## 2024-04-29 MED ORDER — CIPROFLOXACIN IN D5W 400 MG/200ML IV SOLN
INTRAVENOUS | Status: AC
  Filled 2024-04-29: qty 200

## 2024-04-29 MED ORDER — ACETAMINOPHEN 500 MG PO TABS
ORAL_TABLET | ORAL | Status: AC
  Filled 2024-04-29: qty 2

## 2024-04-29 MED ORDER — MIDAZOLAM HCL 2 MG/2ML IJ SOLN
INTRAMUSCULAR | Status: AC
  Filled 2024-04-29: qty 2

## 2024-04-29 MED ORDER — MIDAZOLAM HCL 5 MG/5ML IJ SOLN
INTRAMUSCULAR | Status: DC | PRN
  Administered 2024-04-29: 2 mg via INTRAVENOUS

## 2024-04-29 MED ORDER — FENTANYL CITRATE (PF) 100 MCG/2ML IJ SOLN
INTRAMUSCULAR | Status: AC
  Filled 2024-04-29: qty 2

## 2024-04-29 MED ORDER — FENTANYL CITRATE (PF) 100 MCG/2ML IJ SOLN
INTRAMUSCULAR | Status: DC | PRN
  Administered 2024-04-29 (×2): 25 ug via INTRAVENOUS

## 2024-04-29 SURGICAL SUPPLY — 40 items
BLADE CLIPPER SURG (BLADE) IMPLANT
BLADE SURG 15 STRL LF DISP TIS (BLADE) ×1 IMPLANT
CANISTER SUCT 1200ML W/VALVE (MISCELLANEOUS) IMPLANT
CHLORAPREP W/TINT 26 (MISCELLANEOUS) ×1 IMPLANT
CLSR STERI-STRIP ANTIMIC 1/2X4 (GAUZE/BANDAGES/DRESSINGS) ×1 IMPLANT
COVER BACK TABLE 60X90IN (DRAPES) ×1 IMPLANT
COVER MAYO STAND STRL (DRAPES) ×1 IMPLANT
DERMABOND ADVANCED .7 DNX12 (GAUZE/BANDAGES/DRESSINGS) ×1 IMPLANT
DRAPE LAPAROTOMY 100X72 PEDS (DRAPES) ×1 IMPLANT
DRAPE UTILITY XL STRL (DRAPES) ×1 IMPLANT
DRSG TEGADERM 4X4.75 (GAUZE/BANDAGES/DRESSINGS) IMPLANT
ELECT COATED BLADE 2.86 ST (ELECTRODE) IMPLANT
ELECTRODE REM PT RTRN 9FT ADLT (ELECTROSURGICAL) ×1 IMPLANT
GAUZE PACKING IODOFORM 1/4X15 (PACKING) IMPLANT
GAUZE SPONGE 4X4 12PLY STRL LF (GAUZE/BANDAGES/DRESSINGS) IMPLANT
GLOVE BIO SURGEON STRL SZ7 (GLOVE) ×1 IMPLANT
GLOVE BIOGEL PI IND STRL 7.5 (GLOVE) ×1 IMPLANT
GOWN STRL REUS W/ TWL LRG LVL3 (GOWN DISPOSABLE) ×3 IMPLANT
KIT MARKER MARGIN INK (KITS) IMPLANT
NDL HYPO 25X1 1.5 SAFETY (NEEDLE) ×1 IMPLANT
NEEDLE HYPO 25X1 1.5 SAFETY (NEEDLE) ×1 IMPLANT
NS IRRIG 1000ML POUR BTL (IV SOLUTION) IMPLANT
PACK BASIN DAY SURGERY FS (CUSTOM PROCEDURE TRAY) ×1 IMPLANT
PENCIL SMOKE EVACUATOR (MISCELLANEOUS) ×1 IMPLANT
SLEEVE SCD COMPRESS KNEE MED (STOCKING) ×1 IMPLANT
SPIKE FLUID TRANSFER (MISCELLANEOUS) IMPLANT
SPONGE T-LAP 4X18 ~~LOC~~+RFID (SPONGE) ×1 IMPLANT
SUT ETHILON 2 0 FS 18 (SUTURE) IMPLANT
SUT MNCRL AB 4-0 PS2 18 (SUTURE) ×1 IMPLANT
SUT SILK 2 0 SH (SUTURE) IMPLANT
SUT VIC AB 2-0 SH 27XBRD (SUTURE) IMPLANT
SUT VIC AB 4-0 PS2 18 (SUTURE) IMPLANT
SUT VICRYL 3-0 CR8 SH (SUTURE) IMPLANT
SWAB COLLECTION DEVICE MRSA (MISCELLANEOUS) IMPLANT
SWAB CULTURE ESWAB REG 1ML (MISCELLANEOUS) IMPLANT
SYR CONTROL 10ML LL (SYRINGE) ×1 IMPLANT
TOWEL GREEN STERILE FF (TOWEL DISPOSABLE) ×1 IMPLANT
TUBE CONNECTING 20X1/4 (TUBING) IMPLANT
UNDERPAD 30X36 HEAVY ABSORB (UNDERPADS AND DIAPERS) IMPLANT
YANKAUER SUCT BULB TIP NO VENT (SUCTIONS) IMPLANT

## 2024-04-29 NOTE — Anesthesia Procedure Notes (Signed)
 Procedure Name: LMA Insertion Date/Time: 04/29/2024 12:25 PM  Performed by: Noralyn Beams, CRNAPre-anesthesia Checklist: Patient identified, Emergency Drugs available, Suction available and Patient being monitored Patient Re-evaluated:Patient Re-evaluated prior to induction Oxygen Delivery Method: Circle system utilized Preoxygenation: Pre-oxygenation with 100% oxygen Induction Type: IV induction Ventilation: Mask ventilation without difficulty LMA: LMA inserted LMA Size: 5.0 Number of attempts: 1 Airway Equipment and Method: Bite block Placement Confirmation: positive ETCO2 Tube secured with: Tape Dental Injury: Teeth and Oropharynx as per pre-operative assessment

## 2024-04-29 NOTE — Anesthesia Postprocedure Evaluation (Signed)
 Anesthesia Post Note  Patient: Jesse Perez  Procedure(s) Performed: EXCISION, MASS, CHEST WALL (Right)     Patient location during evaluation: PACU Anesthesia Type: General Level of consciousness: awake and alert Pain management: pain level controlled Vital Signs Assessment: post-procedure vital signs reviewed and stable Respiratory status: spontaneous breathing, nonlabored ventilation and respiratory function stable Cardiovascular status: blood pressure returned to baseline and stable Postop Assessment: no apparent nausea or vomiting Anesthetic complications: no   No notable events documented.  Last Vitals:  Vitals:   04/29/24 1330 04/29/24 1354  BP: (!) 156/96 (!) 164/87  Pulse: 61 62  Resp: 14 16  Temp:  (!) 36.3 C  SpO2: 97% 97%    Last Pain:  Vitals:   04/29/24 1354  TempSrc:   PainSc: 0-No pain                 Earvin Goldberg

## 2024-04-29 NOTE — Op Note (Signed)
 Preoperative diagnosis: Recurrent chest wall neurofibroma Postoperative diagnosis: Same as above Procedure: Wide local excision of chest wall melanoma, 13 cm x 5 cm x 1 cm Surgeon: Dr. Donavan Fuchs Anesthesia: General Estimated blood loss: Less than 50 cc Complications: None Drains: None Specimens: Chest wall mass marked short superior, long lateral Disposition recovery stable addition  Indications: This is a 73 year old male with a history of diabetes, lung cancer as well as melanoma who presents with a recurrent right chest wall mass.  This area has been excised by his dermatologist several times.  This was done to clear margins before and was a neurofibroma.  This area has recurred and is quite sizable now.  I obtained an MRI and it is an over 4 cm lesion but it does not look like it is here in 2 any of the muscle around.  It looks like it remains just in the soft tissue.  We discussed a wide local excision under anesthesia.  Procedure: After informed consent was obtained he was taken to the operating room.  He was given antibiotics.  SCDs were in place.  He was placed under general anesthesia without complication.  He was prepped and draped in standard sterile surgical fashion.  Surgical timeout was then performed.  I made a wide elliptical incision to get clear margins around this mass.  This eventually ended up being 13 cm x 5 cm that was 1 cm deep.  I then removed the skin the mass the soft tissue all the way down to his fascia.  It felt like I grossly had clear margins.  I then marked this and passed this off the table.  I obtained hemostasis.  I closed the deep tissue with 2-0 Vicryl's and ended up closing the skin after undermining to get it closed with 2-0 nylon sutures.  Dressings were placed.  He tolerated this well was extubated and transferred recovery stable.

## 2024-04-29 NOTE — Anesthesia Preprocedure Evaluation (Signed)
 Anesthesia Evaluation  Patient identified by MRN, date of birth, ID band Patient awake    Reviewed: Allergy & Precautions, NPO status , Patient's Chart, lab work & pertinent test results  Airway Mallampati: III  TM Distance: >3 FB Neck ROM: Full  Mouth opening: Limited Mouth Opening  Dental  (+) Teeth Intact, Dental Advisory Given   Pulmonary asthma , sleep apnea (does not use CPAP) , former smoker   Pulmonary exam normal breath sounds clear to auscultation       Cardiovascular hypertension, Pt. on medications Normal cardiovascular exam+ dysrhythmias Atrial Fibrillation  Rhythm:Regular Rate:Normal  AAA 4.0cm   Neuro/Psych negative neurological ROS  negative psych ROS   GI/Hepatic Neg liver ROS,GERD  ,,  Endo/Other  diabetes, Type 2, Oral Hypoglycemic Agents    Renal/GU negative Renal ROS  negative genitourinary   Musculoskeletal negative musculoskeletal ROS (+)    Abdominal   Peds  Hematology negative hematology ROS (+)   Anesthesia Other Findings   Reproductive/Obstetrics                             Anesthesia Physical Anesthesia Plan  ASA: 3  Anesthesia Plan: General   Post-op Pain Management: Tylenol  PO (pre-op)*   Induction: Intravenous  PONV Risk Score and Plan: 2 and Midazolam , Ondansetron  and Treatment may vary due to age or medical condition  Airway Management Planned: LMA  Additional Equipment:   Intra-op Plan:   Post-operative Plan: Extubation in OR  Informed Consent: I have reviewed the patients History and Physical, chart, labs and discussed the procedure including the risks, benefits and alternatives for the proposed anesthesia with the patient or authorized representative who has indicated his/her understanding and acceptance.     Dental advisory given  Plan Discussed with: CRNA  Anesthesia Plan Comments:         Anesthesia Quick Evaluation

## 2024-04-29 NOTE — Transfer of Care (Signed)
 Immediate Anesthesia Transfer of Care Note  Patient: Jesse Perez  Procedure(s) Performed: EXCISION, MASS, CHEST WALL (Right)  Patient Location: PACU  Anesthesia Type:General  Level of Consciousness: awake, alert , and oriented  Airway & Oxygen Therapy: Patient Spontanous Breathing and Patient connected to face mask oxygen  Post-op Assessment: Report given to RN and Post -op Vital signs reviewed and stable  Post vital signs: Reviewed and stable  Last Vitals:  Vitals Value Taken Time  BP 160/84 04/29/24 1315  Temp 36.3 C 04/29/24 1316  Pulse 62 04/29/24 1317  Resp 12 04/29/24 1317  SpO2 100 % 04/29/24 1317  Vitals shown include unfiled device data.  Last Pain:  Vitals:   04/29/24 1114  TempSrc: Temporal         Complications: No notable events documented.

## 2024-04-29 NOTE — Discharge Instructions (Addendum)
 A prescription for pain medication may be given to you upon discharge.  Take your pain medication as prescribed, if needed.  If narcotic pain medicine is not needed, then you may take acetaminophen  (Tylenol ), naprosyn (Alleve), or ibuprofen (Advil) as needed. Take your usually prescribed medications unless otherwise directed. If you need a refill on your pain medication, please contact your pharmacy.  They will contact our office to request authorization. Prescriptions will not be filled after 5pm or on week-ends. You should follow a light diet the first day after arrival home, such as soup and crackers, etc.  Be sure to include lots of fluids daily. Most patients will experience some swelling and bruising in the area of the incisions.  Ice packs will help.  Swelling and bruising can take several days to resolve.  It is common to experience some constipation if taking pain medication after surgery.  Increasing fluid intake and taking a stool softener (such as Colace) will usually help or prevent this problem from occurring.  A mild laxative (Milk of Magnesia or Miralax) should be taken according to package instructions if there are no bowel movements after 48 hours. Unless discharge instructions indicate otherwise, you may remove your bandages 48 hours after surgery, and you may shower at that time.  You may have steri-strips (small skin tapes) in place directly over the incision.  These strips should be left on the skin for 7-10 days.  If your surgeon used skin glue on the incision, you may shower in 24 hours.  The glue will flake off over the next 2-3 weeks.  Any sutures or staples will be removed at the office during your follow-up visit. ACTIVITIES:  You may resume regular (light) daily activities beginning the next day--such as daily self-care, walking, climbing stairs--gradually increasing activities as tolerated.  You may have sexual intercourse when it is  comfortable.  Refrain from any heavy lifting or straining until approved by your doctor. You may drive when you are no longer taking prescription pain medication, you can comfortably wear a seatbelt, and you can safely maneuver your car and apply brakes. RETURN TO WORK:  __________________________________________________________ Jesse Perez should see your doctor in the office for a follow-up appointment approximately 2-3 weeks after your surgery.  Make sure that you call for this appointment within a day or two after you arrive home to insure a convenient appointment time. OTHER INSTRUCTIONS: __________________________________________________________________________________________________________________________ __________________________________________________________________________________________________________________________ WHEN TO CALL YOUR DOCTOR: Fever over 101.0 Inability to urinate Continued bleeding from incision. Increased pain, redness, or drainage from the incision. Increasing abdominal pain  The clinic staff is available to answer your questions during regular business hours.  Please don't hesitate to call and ask to speak to one of the nurses for clinical concerns.  If you have a medical emergency, go to the nearest emergency room or call 911.  A surgeon from [facility] is always on call at the hospital. [address], [place], [place]  [address] ? [address] [address], [place], [place]   [address] [phone] ? [phone] ? FAX [phone] Web site: [email or web address]    Post Anesthesia Home Care Instructions  Activity: Get plenty of rest for the remainder of the day. A responsible individual must stay with you for 24 hours following the procedure.  For the next 24 hours, DO NOT: -Drive a car -Advertising copywriter -Drink alcoholic beverages -Take any medication unless instructed by your physician -Make any legal decisions or sign  important papers.  Meals:  Start with liquid foods such as gelatin or soup. Progress to regular foods as tolerated. Avoid greasy, spicy, heavy foods. If nausea and/or vomiting occur, drink only clear liquids until the nausea and/or vomiting subsides. Call your physician if vomiting continues.  Special Instructions/Symptoms: Your throat may feel dry or sore from the anesthesia or the breathing tube placed in your throat during surgery. If this causes discomfort, gargle with warm salt water. The discomfort should disappear within 24 hours.  If you had a scopolamine patch placed behind your ear for the management of post- operative nausea and/or vomiting:  1. The medication in the patch is effective for 72 hours, after which it should be removed.  Wrap patch in a tissue and discard in the trash. Wash hands thoroughly with soap and water. 2. You may remove the patch earlier than 72 hours if you experience unpleasant side effects which may include dry mouth, dizziness or visual disturbances. 3. Avoid touching the patch. Wash your hands with soap and water after contact with the patch.     Last received tylenol  at 1125am

## 2024-04-29 NOTE — Interval H&P Note (Signed)
 History and Physical Interval Note:  04/29/2024 11:57 AM  Jesse Perez  has presented today for surgery, with the diagnosis of RIGHT CHEST WALL MASS.  The various methods of treatment have been discussed with the patient and family. After consideration of risks, benefits and other options for treatment, the patient has consented to  Procedure(s) with comments: EXCISION, MASS, CHEST WALL (Right) - WIDE LOCAL EXCISION RIGHT CHEST NEUROFIBROMA as a surgical intervention.  The patient's history has been reviewed, patient examined, no change in status, stable for surgery.  I have reviewed the patient's chart and labs.  Questions were answered to the patient's satisfaction.     Enid Harry

## 2024-04-30 ENCOUNTER — Encounter (HOSPITAL_BASED_OUTPATIENT_CLINIC_OR_DEPARTMENT_OTHER): Payer: Self-pay | Admitting: General Surgery

## 2024-05-04 DIAGNOSIS — D225 Melanocytic nevi of trunk: Secondary | ICD-10-CM | POA: Diagnosis not present

## 2024-05-04 DIAGNOSIS — L905 Scar conditions and fibrosis of skin: Secondary | ICD-10-CM | POA: Diagnosis not present

## 2024-05-04 DIAGNOSIS — D2261 Melanocytic nevi of right upper limb, including shoulder: Secondary | ICD-10-CM | POA: Diagnosis not present

## 2024-05-04 DIAGNOSIS — D485 Neoplasm of uncertain behavior of skin: Secondary | ICD-10-CM | POA: Diagnosis not present

## 2024-05-04 DIAGNOSIS — Z85820 Personal history of malignant melanoma of skin: Secondary | ICD-10-CM | POA: Diagnosis not present

## 2024-05-04 DIAGNOSIS — D2262 Melanocytic nevi of left upper limb, including shoulder: Secondary | ICD-10-CM | POA: Diagnosis not present

## 2024-05-04 DIAGNOSIS — Z85828 Personal history of other malignant neoplasm of skin: Secondary | ICD-10-CM | POA: Diagnosis not present

## 2024-05-04 DIAGNOSIS — C44519 Basal cell carcinoma of skin of other part of trunk: Secondary | ICD-10-CM | POA: Diagnosis not present

## 2024-05-04 DIAGNOSIS — D2272 Melanocytic nevi of left lower limb, including hip: Secondary | ICD-10-CM | POA: Diagnosis not present

## 2024-05-08 LAB — SURGICAL PATHOLOGY

## 2024-05-30 DIAGNOSIS — Z85118 Personal history of other malignant neoplasm of bronchus and lung: Secondary | ICD-10-CM | POA: Diagnosis not present

## 2024-05-30 DIAGNOSIS — Z8546 Personal history of malignant neoplasm of prostate: Secondary | ICD-10-CM | POA: Diagnosis not present

## 2024-05-30 DIAGNOSIS — E785 Hyperlipidemia, unspecified: Secondary | ICD-10-CM | POA: Diagnosis not present

## 2024-05-30 DIAGNOSIS — I10 Essential (primary) hypertension: Secondary | ICD-10-CM | POA: Diagnosis not present

## 2024-06-02 DIAGNOSIS — G629 Polyneuropathy, unspecified: Secondary | ICD-10-CM | POA: Diagnosis not present

## 2024-06-02 DIAGNOSIS — M21371 Foot drop, right foot: Secondary | ICD-10-CM | POA: Diagnosis not present

## 2024-06-02 DIAGNOSIS — E118 Type 2 diabetes mellitus with unspecified complications: Secondary | ICD-10-CM | POA: Diagnosis not present

## 2024-06-02 DIAGNOSIS — E782 Mixed hyperlipidemia: Secondary | ICD-10-CM | POA: Diagnosis not present

## 2024-06-02 DIAGNOSIS — I10 Essential (primary) hypertension: Secondary | ICD-10-CM | POA: Diagnosis not present

## 2024-06-02 DIAGNOSIS — N183 Chronic kidney disease, stage 3 unspecified: Secondary | ICD-10-CM | POA: Diagnosis not present

## 2024-06-16 DIAGNOSIS — Z85118 Personal history of other malignant neoplasm of bronchus and lung: Secondary | ICD-10-CM | POA: Diagnosis not present

## 2024-06-16 DIAGNOSIS — C4359 Malignant melanoma of other part of trunk: Secondary | ICD-10-CM | POA: Diagnosis not present

## 2024-06-16 DIAGNOSIS — Z08 Encounter for follow-up examination after completed treatment for malignant neoplasm: Secondary | ICD-10-CM | POA: Diagnosis not present

## 2024-06-16 DIAGNOSIS — R0789 Other chest pain: Secondary | ICD-10-CM | POA: Diagnosis not present

## 2024-06-16 DIAGNOSIS — C3492 Malignant neoplasm of unspecified part of left bronchus or lung: Secondary | ICD-10-CM | POA: Diagnosis not present

## 2024-06-16 DIAGNOSIS — C439 Malignant melanoma of skin, unspecified: Secondary | ICD-10-CM | POA: Diagnosis not present

## 2024-06-16 DIAGNOSIS — R0602 Shortness of breath: Secondary | ICD-10-CM | POA: Diagnosis not present

## 2024-06-16 DIAGNOSIS — C4362 Malignant melanoma of left upper limb, including shoulder: Secondary | ICD-10-CM | POA: Diagnosis not present

## 2024-06-29 DIAGNOSIS — I10 Essential (primary) hypertension: Secondary | ICD-10-CM | POA: Diagnosis not present

## 2024-06-29 DIAGNOSIS — Z85118 Personal history of other malignant neoplasm of bronchus and lung: Secondary | ICD-10-CM | POA: Diagnosis not present

## 2024-06-29 DIAGNOSIS — Z8546 Personal history of malignant neoplasm of prostate: Secondary | ICD-10-CM | POA: Diagnosis not present

## 2024-06-29 DIAGNOSIS — E785 Hyperlipidemia, unspecified: Secondary | ICD-10-CM | POA: Diagnosis not present

## 2024-07-07 DIAGNOSIS — M21371 Foot drop, right foot: Secondary | ICD-10-CM | POA: Diagnosis not present

## 2024-11-04 DIAGNOSIS — L218 Other seborrheic dermatitis: Secondary | ICD-10-CM | POA: Diagnosis not present

## 2024-11-04 DIAGNOSIS — Z85820 Personal history of malignant melanoma of skin: Secondary | ICD-10-CM | POA: Diagnosis not present

## 2024-11-04 DIAGNOSIS — D2272 Melanocytic nevi of left lower limb, including hip: Secondary | ICD-10-CM | POA: Diagnosis not present

## 2024-11-04 DIAGNOSIS — D225 Melanocytic nevi of trunk: Secondary | ICD-10-CM | POA: Diagnosis not present

## 2024-11-04 DIAGNOSIS — D2271 Melanocytic nevi of right lower limb, including hip: Secondary | ICD-10-CM | POA: Diagnosis not present

## 2024-11-04 DIAGNOSIS — L905 Scar conditions and fibrosis of skin: Secondary | ICD-10-CM | POA: Diagnosis not present

## 2024-11-04 DIAGNOSIS — Z85828 Personal history of other malignant neoplasm of skin: Secondary | ICD-10-CM | POA: Diagnosis not present

## 2024-12-15 DIAGNOSIS — J219 Acute bronchiolitis, unspecified: Secondary | ICD-10-CM | POA: Diagnosis not present

## 2024-12-15 DIAGNOSIS — C4361 Malignant melanoma of right upper limb, including shoulder: Secondary | ICD-10-CM | POA: Diagnosis not present

## 2024-12-15 DIAGNOSIS — C3492 Malignant neoplasm of unspecified part of left bronchus or lung: Secondary | ICD-10-CM | POA: Diagnosis not present

## 2024-12-15 DIAGNOSIS — C439 Malignant melanoma of skin, unspecified: Secondary | ICD-10-CM | POA: Diagnosis not present

## 2024-12-15 DIAGNOSIS — J432 Centrilobular emphysema: Secondary | ICD-10-CM | POA: Diagnosis not present
